# Patient Record
Sex: Female | Born: 1950 | Race: White | Hispanic: No | Marital: Married | Smoking: Never smoker
Health system: Southern US, Community
[De-identification: ages and names within clinical notes are randomized; demographics above are authoritative.]

## PROBLEM LIST (undated history)

## (undated) DIAGNOSIS — D649 Anemia, unspecified: Secondary | ICD-10-CM

## (undated) DIAGNOSIS — M1990 Unspecified osteoarthritis, unspecified site: Secondary | ICD-10-CM

## (undated) DIAGNOSIS — Z9221 Personal history of antineoplastic chemotherapy: Secondary | ICD-10-CM

## (undated) DIAGNOSIS — H9190 Unspecified hearing loss, unspecified ear: Secondary | ICD-10-CM

## (undated) DIAGNOSIS — H269 Unspecified cataract: Secondary | ICD-10-CM

## (undated) DIAGNOSIS — K219 Gastro-esophageal reflux disease without esophagitis: Secondary | ICD-10-CM

## (undated) DIAGNOSIS — C50919 Malignant neoplasm of unspecified site of unspecified female breast: Secondary | ICD-10-CM

## (undated) DIAGNOSIS — E785 Hyperlipidemia, unspecified: Secondary | ICD-10-CM

## (undated) DIAGNOSIS — I10 Essential (primary) hypertension: Secondary | ICD-10-CM

## (undated) DIAGNOSIS — Z923 Personal history of irradiation: Secondary | ICD-10-CM

## (undated) DIAGNOSIS — E119 Type 2 diabetes mellitus without complications: Secondary | ICD-10-CM

## (undated) HISTORY — PX: OTHER SURGICAL HISTORY: SHX169

## (undated) HISTORY — DX: Essential (primary) hypertension: I10

## (undated) HISTORY — DX: Anemia, unspecified: D64.9

## (undated) HISTORY — DX: Unspecified cataract: H26.9

## (undated) HISTORY — PX: COLONOSCOPY: SHX174

## (undated) HISTORY — DX: Type 2 diabetes mellitus without complications: E11.9

## (undated) HISTORY — DX: Malignant neoplasm of unspecified site of unspecified female breast: C50.919

## (undated) HISTORY — PX: TUBAL LIGATION: SHX77

## (undated) HISTORY — PX: NASAL SINUS SURGERY: SHX719

## (undated) HISTORY — PX: WISDOM TOOTH EXTRACTION: SHX21

## (undated) HISTORY — DX: Hyperlipidemia, unspecified: E78.5

## (undated) HISTORY — PX: TONSILLECTOMY: SUR1361

---

## 2003-05-11 DIAGNOSIS — C50919 Malignant neoplasm of unspecified site of unspecified female breast: Secondary | ICD-10-CM

## 2003-05-11 HISTORY — PX: BREAST LUMPECTOMY: SHX2

## 2003-05-11 HISTORY — DX: Malignant neoplasm of unspecified site of unspecified female breast: C50.919

## 2003-05-11 HISTORY — PX: BREAST SURGERY: SHX581

## 2003-10-29 ENCOUNTER — Encounter: Admission: RE | Admit: 2003-10-29 | Discharge: 2003-10-29 | Payer: Self-pay | Admitting: General Surgery

## 2003-10-29 ENCOUNTER — Encounter (INDEPENDENT_AMBULATORY_CARE_PROVIDER_SITE_OTHER): Payer: Self-pay | Admitting: *Deleted

## 2003-11-04 ENCOUNTER — Encounter (HOSPITAL_COMMUNITY): Admission: RE | Admit: 2003-11-04 | Discharge: 2004-02-02 | Payer: Self-pay | Admitting: General Surgery

## 2003-11-04 ENCOUNTER — Encounter: Payer: Self-pay | Admitting: General Surgery

## 2003-11-18 ENCOUNTER — Encounter (INDEPENDENT_AMBULATORY_CARE_PROVIDER_SITE_OTHER): Payer: Self-pay | Admitting: *Deleted

## 2003-11-18 ENCOUNTER — Ambulatory Visit (HOSPITAL_COMMUNITY): Admission: RE | Admit: 2003-11-18 | Discharge: 2003-11-18 | Payer: Self-pay | Admitting: General Surgery

## 2003-12-10 ENCOUNTER — Ambulatory Visit: Admission: RE | Admit: 2003-12-10 | Discharge: 2004-01-23 | Payer: Self-pay | Admitting: *Deleted

## 2003-12-18 ENCOUNTER — Ambulatory Visit: Admission: RE | Admit: 2003-12-18 | Discharge: 2003-12-18 | Payer: Self-pay | Admitting: Oncology

## 2003-12-18 ENCOUNTER — Ambulatory Visit (HOSPITAL_COMMUNITY): Admission: RE | Admit: 2003-12-18 | Discharge: 2003-12-18 | Payer: Self-pay | Admitting: General Surgery

## 2004-03-17 ENCOUNTER — Ambulatory Visit: Admission: RE | Admit: 2004-03-17 | Discharge: 2004-05-25 | Payer: Self-pay | Admitting: *Deleted

## 2004-05-01 ENCOUNTER — Ambulatory Visit (HOSPITAL_BASED_OUTPATIENT_CLINIC_OR_DEPARTMENT_OTHER): Admission: RE | Admit: 2004-05-01 | Discharge: 2004-05-01 | Payer: Self-pay | Admitting: General Surgery

## 2004-06-17 ENCOUNTER — Ambulatory Visit: Admission: RE | Admit: 2004-06-17 | Discharge: 2004-06-17 | Payer: Self-pay | Admitting: *Deleted

## 2004-06-24 ENCOUNTER — Ambulatory Visit: Payer: Self-pay | Admitting: Oncology

## 2004-07-28 ENCOUNTER — Encounter: Admission: RE | Admit: 2004-07-28 | Discharge: 2004-07-28 | Payer: Self-pay | Admitting: General Surgery

## 2004-08-03 ENCOUNTER — Encounter: Admission: RE | Admit: 2004-08-03 | Discharge: 2004-08-03 | Payer: Self-pay | Admitting: General Surgery

## 2004-08-05 ENCOUNTER — Encounter: Admission: RE | Admit: 2004-08-05 | Discharge: 2004-08-05 | Payer: Self-pay | Admitting: General Surgery

## 2004-11-18 ENCOUNTER — Ambulatory Visit: Payer: Self-pay | Admitting: Oncology

## 2005-05-07 ENCOUNTER — Ambulatory Visit: Payer: Self-pay | Admitting: Oncology

## 2005-08-04 ENCOUNTER — Encounter: Admission: RE | Admit: 2005-08-04 | Discharge: 2005-08-04 | Payer: Self-pay | Admitting: Oncology

## 2005-10-29 ENCOUNTER — Ambulatory Visit: Payer: Self-pay | Admitting: Oncology

## 2005-12-24 ENCOUNTER — Ambulatory Visit: Payer: Self-pay | Admitting: Oncology

## 2006-02-25 ENCOUNTER — Ambulatory Visit: Payer: Self-pay | Admitting: Oncology

## 2006-04-20 ENCOUNTER — Ambulatory Visit: Payer: Self-pay | Admitting: Oncology

## 2006-04-22 LAB — COMPREHENSIVE METABOLIC PANEL
ALT: 24 U/L (ref 0–35)
AST: 20 U/L (ref 0–37)
Albumin: 4.8 g/dL (ref 3.5–5.2)
Alkaline Phosphatase: 81 U/L (ref 39–117)
BUN: 20 mg/dL (ref 6–23)
CO2: 25 mEq/L (ref 19–32)
Calcium: 9.8 mg/dL (ref 8.4–10.5)
Chloride: 101 mEq/L (ref 96–112)
Creatinine, Ser: 0.63 mg/dL (ref 0.40–1.20)
Glucose, Bld: 215 mg/dL — ABNORMAL HIGH (ref 70–99)
Potassium: 4.1 mEq/L (ref 3.5–5.3)
Sodium: 139 mEq/L (ref 135–145)
Total Bilirubin: 0.4 mg/dL (ref 0.3–1.2)
Total Protein: 7.5 g/dL (ref 6.0–8.3)

## 2006-04-22 LAB — CBC WITH DIFFERENTIAL/PLATELET
Basophils Absolute: 0 10*3/uL (ref 0.0–0.1)
Eosinophils Absolute: 0.1 10*3/uL (ref 0.0–0.5)
HCT: 39.9 % (ref 34.8–46.6)
HGB: 13.4 g/dL (ref 11.6–15.9)
MCH: 30.3 pg (ref 26.0–34.0)
MONO#: 0.4 10*3/uL (ref 0.1–0.9)
NEUT#: 2.8 10*3/uL (ref 1.5–6.5)
NEUT%: 58 % (ref 39.6–76.8)
WBC: 4.8 10*3/uL (ref 3.9–10.0)
lymph#: 1.4 10*3/uL (ref 0.9–3.3)

## 2006-08-08 ENCOUNTER — Encounter: Admission: RE | Admit: 2006-08-08 | Discharge: 2006-08-08 | Payer: Self-pay | Admitting: Oncology

## 2006-10-13 ENCOUNTER — Ambulatory Visit: Payer: Self-pay | Admitting: Oncology

## 2007-03-13 ENCOUNTER — Ambulatory Visit: Payer: Self-pay | Admitting: Internal Medicine

## 2007-03-24 ENCOUNTER — Ambulatory Visit: Payer: Self-pay | Admitting: Internal Medicine

## 2007-03-24 ENCOUNTER — Encounter: Payer: Self-pay | Admitting: Internal Medicine

## 2007-04-14 ENCOUNTER — Ambulatory Visit: Payer: Self-pay | Admitting: Oncology

## 2007-04-18 LAB — CBC WITH DIFFERENTIAL/PLATELET
BASO%: 0.5 % (ref 0.0–2.0)
Basophils Absolute: 0 10*3/uL (ref 0.0–0.1)
EOS%: 3.9 % (ref 0.0–7.0)
HCT: 38.6 % (ref 34.8–46.6)
HGB: 13.4 g/dL (ref 11.6–15.9)
MCHC: 34.7 g/dL (ref 32.0–36.0)
MONO#: 0.4 10*3/uL (ref 0.1–0.9)
NEUT%: 55.4 % (ref 39.6–76.8)
RDW: 13.1 % (ref 11.3–14.5)
WBC: 5.7 10*3/uL (ref 3.9–10.0)
lymph#: 1.9 10*3/uL (ref 0.9–3.3)

## 2007-04-18 LAB — COMPREHENSIVE METABOLIC PANEL
ALT: 27 U/L (ref 0–35)
AST: 22 U/L (ref 0–37)
Albumin: 4.2 g/dL (ref 3.5–5.2)
CO2: 23 mEq/L (ref 19–32)
Calcium: 9.1 mg/dL (ref 8.4–10.5)
Chloride: 103 mEq/L (ref 96–112)
Creatinine, Ser: 0.59 mg/dL (ref 0.40–1.20)
Potassium: 3.8 mEq/L (ref 3.5–5.3)

## 2007-08-09 ENCOUNTER — Encounter: Admission: RE | Admit: 2007-08-09 | Discharge: 2007-08-09 | Payer: Self-pay | Admitting: Oncology

## 2007-10-13 ENCOUNTER — Ambulatory Visit: Payer: Self-pay | Admitting: Oncology

## 2007-10-19 ENCOUNTER — Encounter: Admission: RE | Admit: 2007-10-19 | Discharge: 2007-10-19 | Payer: Self-pay | Admitting: General Surgery

## 2008-04-16 ENCOUNTER — Ambulatory Visit: Payer: Self-pay | Admitting: Oncology

## 2008-04-18 LAB — COMPREHENSIVE METABOLIC PANEL
ALT: 21 U/L (ref 0–35)
BUN: 16 mg/dL (ref 6–23)
CO2: 24 mEq/L (ref 19–32)
Potassium: 3.9 mEq/L (ref 3.5–5.3)
Sodium: 138 mEq/L (ref 135–145)
Total Protein: 7.2 g/dL (ref 6.0–8.3)

## 2008-04-18 LAB — CBC WITH DIFFERENTIAL/PLATELET
BASO%: 0.5 % (ref 0.0–2.0)
Basophils Absolute: 0 10*3/uL (ref 0.0–0.1)
HCT: 37.9 % (ref 34.8–46.6)
HGB: 12.9 g/dL (ref 11.6–15.9)
LYMPH%: 33.5 % (ref 14.0–48.0)
MCH: 30.5 pg (ref 26.0–34.0)
MCHC: 34.1 g/dL (ref 32.0–36.0)
MCV: 89.6 fL (ref 81.0–101.0)
MONO%: 7.6 % (ref 0.0–13.0)
NEUT#: 3.1 10*3/uL (ref 1.5–6.5)
WBC: 5.7 10*3/uL (ref 3.9–10.0)

## 2008-08-22 ENCOUNTER — Encounter: Admission: RE | Admit: 2008-08-22 | Discharge: 2008-08-22 | Payer: Self-pay | Admitting: Oncology

## 2008-10-14 ENCOUNTER — Ambulatory Visit: Payer: Self-pay | Admitting: Oncology

## 2009-04-29 ENCOUNTER — Ambulatory Visit: Payer: Self-pay | Admitting: Oncology

## 2009-05-01 LAB — CBC WITH DIFFERENTIAL/PLATELET
HCT: 39.4 % (ref 34.8–46.6)
HGB: 13.2 g/dL (ref 11.6–15.9)
MCH: 30.5 pg (ref 25.1–34.0)
MCHC: 33.5 g/dL (ref 31.5–36.0)
MCV: 91 fL (ref 79.5–101.0)
NEUT#: 3.2 10*3/uL (ref 1.5–6.5)
Platelets: 270 10*3/uL (ref 145–400)
RDW: 13.4 % (ref 11.2–14.5)
WBC: 5.5 10*3/uL (ref 3.9–10.3)
nRBC: 0 % (ref 0–0)

## 2009-05-01 LAB — COMPREHENSIVE METABOLIC PANEL
Alkaline Phosphatase: 71 U/L (ref 39–117)
BUN: 15 mg/dL (ref 6–23)
CO2: 27 mEq/L (ref 19–32)
Glucose, Bld: 199 mg/dL — ABNORMAL HIGH (ref 70–99)
Potassium: 3.5 mEq/L (ref 3.5–5.3)
Sodium: 140 mEq/L (ref 135–145)
Total Bilirubin: 0.3 mg/dL (ref 0.3–1.2)

## 2009-08-25 ENCOUNTER — Encounter: Admission: RE | Admit: 2009-08-25 | Discharge: 2009-08-25 | Payer: Self-pay | Admitting: Oncology

## 2009-10-28 ENCOUNTER — Encounter: Admission: RE | Admit: 2009-10-28 | Discharge: 2009-10-28 | Payer: Self-pay | Admitting: General Surgery

## 2010-05-13 ENCOUNTER — Ambulatory Visit: Payer: Self-pay | Admitting: Oncology

## 2010-06-24 ENCOUNTER — Other Ambulatory Visit: Payer: Self-pay | Admitting: Oncology

## 2010-06-24 DIAGNOSIS — Z9889 Other specified postprocedural states: Secondary | ICD-10-CM

## 2010-08-27 ENCOUNTER — Ambulatory Visit
Admission: RE | Admit: 2010-08-27 | Discharge: 2010-08-27 | Disposition: A | Discharge status: Home / Self Care | Payer: BC Managed Care – HMO | Source: Ambulatory Visit | Attending: Oncology | Admitting: Oncology

## 2010-08-27 DIAGNOSIS — Z9889 Other specified postprocedural states: Secondary | ICD-10-CM

## 2010-09-25 NOTE — Op Note (Signed)
Krystal [name], Krystal [name]               ACCOUNT NO.:  [id number]   MEDICAL RECORD NO.:  [id number]          PATIENT TYPE:  AMB   LOCATION:  DSC                          FACILITY:  [facility]   PHYSICIAN:  Ollen Gross. Vernell Morgans, M.D. DATE OF BIRTH:  12-12-1950   DATE OF PROCEDURE:  05/01/2004  DATE OF DISCHARGE:  05/01/2004                                 OPERATIVE REPORT   PREOPERATIVE DIAGNOSES:  Left breast cancer.   POSTOPERATIVE DIAGNOSES:  Left breast cancer.   OPERATION PERFORMED:  Removal of right Port-A-Cath.   SURGEON:  Ollen Gross. Carolynne Edouard, M.D.   ANESTHESIA:  Local.   DESCRIPTION OF PROCEDURE:  After informed consent was obtained, the patient  was brought to the operating room and placed in supine position on the  operating table.  The patient's right chest wall area was then prepped with  Betadine and draped in the usual sterile manner.  The area of the port was  then infiltrated with 1% lidocaine with epinephrine.  A small incision was  made through the old incision with a 15 blade knife.  This incision was  carried down through the skin and subcutaneous tissue sharply with a 15  blade knife.  Once the catheter was identified, blunt dissection was carried  out around the catheter until the capsule around the catheter was entered.  The two anchoring stitches were identified and cut and removed.  The port  was then able to be popped out of the cavity and removed from the patient.  Pressure was held for several minutes.  The wound was then closed with a  deep layer of 3-0 Vicryl interrupted stitches and the skin was closed with a  running 4-0 Monocryl subcuticular stitch.  Benzoin and Steri-Strips and  sterile dressings were applied.  The patient tolerated the procedure well.  At the end of the case all sponge, needle and instrument counts were  correct.  The patient was then awakened and taken to recovery room in stable  condition.      [name]   PST/MEDQ  D:  05/03/2004  T:  05/04/2004   Job:  [id number]

## 2011-05-18 ENCOUNTER — Encounter: Payer: BC Managed Care – HMO | Admitting: Oncology

## 2011-05-19 NOTE — Progress Notes (Signed)
This encounter was created in error - please disregard.

## 2011-05-20 ENCOUNTER — Telehealth: Payer: Self-pay | Admitting: Oncology

## 2011-05-20 NOTE — Telephone Encounter (Signed)
lmonvm adviisng the pt of her r/s appts to march 2013

## 2011-07-19 ENCOUNTER — Telehealth: Payer: Self-pay | Admitting: Oncology

## 2011-07-19 NOTE — Telephone Encounter (Signed)
Pt came in on wrong day and 3/12 appt was r/s'd to 3/29. Date per pt.

## 2011-07-20 ENCOUNTER — Ambulatory Visit: Payer: BC Managed Care – HMO | Admitting: Oncology

## 2011-07-20 ENCOUNTER — Other Ambulatory Visit: Payer: BC Managed Care – HMO | Admitting: Lab

## 2011-08-06 ENCOUNTER — Telehealth: Payer: Self-pay | Admitting: Oncology

## 2011-08-06 ENCOUNTER — Ambulatory Visit (HOSPITAL_BASED_OUTPATIENT_CLINIC_OR_DEPARTMENT_OTHER): Payer: BC Managed Care – HMO | Admitting: Oncology

## 2011-08-06 ENCOUNTER — Other Ambulatory Visit: Payer: BC Managed Care – HMO | Admitting: Lab

## 2011-08-06 VITALS — BP 129/80 | HR 86 | Temp 97.7°F | Ht 60.0 in | Wt 170.9 lb

## 2011-08-06 DIAGNOSIS — C50919 Malignant neoplasm of unspecified site of unspecified female breast: Secondary | ICD-10-CM

## 2011-08-06 DIAGNOSIS — Z853 Personal history of malignant neoplasm of breast: Secondary | ICD-10-CM

## 2011-08-06 NOTE — Telephone Encounter (Signed)
gv pt appt for [id number].  scheduled mammogram for 04/29 @ [facility]

## 2011-08-06 NOTE — Progress Notes (Signed)
OFFICE PROGRESS NOTE   INTERVAL HISTORY:   She returns as scheduled. She feels well. Mild chronic discomfort at the right "hip ". No change at either breast. She is due for a mammogram in April. A bilateral mammogram was negative in April of 2012.  Objective:  Vital signs in last 24 hours:  Blood pressure 129/80, pulse 86, temperature 97.7 F (36.5 C), temperature source Oral, height 5' (1.524 m), weight 170 lb 14.4 oz (77.52 kg).    HEENT: Neck without mass Lymphatics: No cervical, supraclavicular, or axillary nodes Resp: Lungs clear bilaterally Cardio: Regular rate and rhythm GI: No hepatomegaly Vascular: No leg edema Breasts: Status post left lumpectomy. No evidence for local tumor recurrence. No discrete mass in either breast. Superior to the left areola and medial to a prominent vein there is a firm superficial 1/2-1 cm oblong area. I cannot palpate a discrete mass in this region. The area of firmness is approximately 1/2 way between the area lab and superior extent of the left breast.     Medications: I have reviewed the patient's current medications.  Assessment/Plan: Ms Rae has history of stage IIA left-sided breast cancer diagnosed in July 2005.  She completed 5 years of adjuvant Arimidex on the MA.27 study.  She completed adjuvant AC chemotherapy and left breast radiation prior to beginning hormonal therapy.   She remains in clinical remission.   Disposition:  She will be scheduled for a bilateral screening mammogram in April of 2013. She will return for an office visit here in December of 2013. She is scheduled to see Dr. Carolynne Edouard in June of this year.  The small firm area in the left breast is likely a benign finding. She will keep an eye on this and contact one of her physician if the area changes.   Lucile Shutters, MD  08/06/2011  3:24 PM

## 2011-09-06 ENCOUNTER — Ambulatory Visit
Admission: RE | Admit: 2011-09-06 | Discharge: 2011-09-06 | Disposition: A | Discharge status: Home / Self Care | Payer: BC Managed Care – PPO | Source: Ambulatory Visit | Attending: Oncology | Admitting: Oncology

## 2011-09-06 DIAGNOSIS — C50919 Malignant neoplasm of unspecified site of unspecified female breast: Secondary | ICD-10-CM

## 2011-10-25 ENCOUNTER — Encounter (INDEPENDENT_AMBULATORY_CARE_PROVIDER_SITE_OTHER): Payer: Self-pay | Admitting: General Surgery

## 2011-10-25 ENCOUNTER — Ambulatory Visit (INDEPENDENT_AMBULATORY_CARE_PROVIDER_SITE_OTHER): Payer: BC Managed Care – PPO | Admitting: General Surgery

## 2011-10-25 VITALS — BP 140/88 | HR 98 | Temp 98.0°F | Resp 14 | Ht 60.0 in | Wt 171.6 lb

## 2011-10-25 DIAGNOSIS — Z853 Personal history of malignant neoplasm of breast: Secondary | ICD-10-CM

## 2011-10-25 DIAGNOSIS — L723 Sebaceous cyst: Secondary | ICD-10-CM

## 2011-10-25 NOTE — Progress Notes (Signed)
Subjective:     Patient ID: Krystal [name], female   DOB: 19-Sep-1950, 61 y.o.   MRN: [id number]  HPI The patient is a 61 year old white female who is 8 years out from a left breast lumpectomy and negative sentinel node biopsy for a T2 N0 left breast cancer. She was ER and PR positive and finished 5 years of Arimidex. She also took chemotherapy and radiation. Her most recent mammogram was in April showed no evidence of malignancy. She denies any breast pain. Her only complaint is of a cyst on her posterior neck. This has been present for a few years and is gradually been getting bigger. It does cause her a little bit of discomfort when turning her head.  Review of Systems  Constitutional: Negative.   HENT: Negative.   Eyes: Negative.   Respiratory: Negative.   Cardiovascular: Negative.   Gastrointestinal: Negative.   Genitourinary: Negative.   Musculoskeletal: Negative.   Skin: Negative.   Neurological: Negative.   Hematological: Negative.   Psychiatric/Behavioral: Negative.        Objective:   Physical Exam  Constitutional: She is oriented to person, place, and time. She appears well-developed and well-nourished.  HENT:  Head: Normocephalic and atraumatic.  Eyes: Conjunctivae and EOM are normal. Pupils are equal, round, and reactive to light.  Neck: Normal range of motion. Neck supple.       There is a 1 cm subcutaneous mass that is mobile in the right posterior neck below the hairline. No evidence of infection.  Cardiovascular: Normal rate, regular rhythm and normal heart sounds.   Pulmonary/Chest: Effort normal and breath sounds normal.       There is no palpable mass in either breast. She does have some tightness to the scar in the 5:00 position of the left breast but this is stable. No palpable axillary supraclavicular or cervical lymphadenopathy.  Abdominal: Soft. Bowel sounds are normal.  Musculoskeletal: Normal range of motion.  Neurological: She is alert and oriented to  person, place, and time.  Skin: Skin is warm and dry.  Psychiatric: She has a normal mood and affect. Her behavior is normal.       Assessment:     8 years status post left breast lumpectomy and negative sentinel node biopsy. She now has a symptomatic sebaceous cyst on her posterior neck    Plan:     Because the area on her neck is bothering her and because the areas getting a little bit larger I think it would be best to have it removed. She would also like to have this done. I've discussed with her in detail the risks and benefits of the operation to remove the cyst from her neck as well as some of the technical aspects and she understands and wishes to proceed

## 2011-10-25 NOTE — Patient Instructions (Signed)
Continue regular self exams  

## 2011-11-22 ENCOUNTER — Other Ambulatory Visit (INDEPENDENT_AMBULATORY_CARE_PROVIDER_SITE_OTHER): Payer: Self-pay | Admitting: General Surgery

## 2011-11-22 DIAGNOSIS — L723 Sebaceous cyst: Secondary | ICD-10-CM

## 2011-11-23 ENCOUNTER — Telehealth (INDEPENDENT_AMBULATORY_CARE_PROVIDER_SITE_OTHER): Payer: Self-pay | Admitting: General Surgery

## 2011-11-23 NOTE — Telephone Encounter (Signed)
Spoke with patient and let her know that her first PO appt will be on 8/5 at 1:40.  I also sent a reminder card in the mail.

## 2011-12-13 ENCOUNTER — Encounter (INDEPENDENT_AMBULATORY_CARE_PROVIDER_SITE_OTHER): Payer: BC Managed Care – PPO | Admitting: General Surgery

## 2012-03-30 ENCOUNTER — Encounter: Payer: Self-pay | Admitting: Internal Medicine

## 2012-05-01 ENCOUNTER — Telehealth: Payer: Self-pay | Admitting: Oncology

## 2012-05-01 ENCOUNTER — Ambulatory Visit (HOSPITAL_BASED_OUTPATIENT_CLINIC_OR_DEPARTMENT_OTHER): Payer: BC Managed Care – PPO | Admitting: Oncology

## 2012-05-01 VITALS — BP 145/97 | HR 84 | Temp 96.8°F | Resp 18 | Ht 60.0 in | Wt 170.9 lb

## 2012-05-01 DIAGNOSIS — Z853 Personal history of malignant neoplasm of breast: Secondary | ICD-10-CM

## 2012-05-01 DIAGNOSIS — R030 Elevated blood-pressure reading, without diagnosis of hypertension: Secondary | ICD-10-CM

## 2012-05-01 NOTE — Progress Notes (Signed)
   [facility]    OFFICE PROGRESS NOTE   INTERVAL HISTORY:   She returns as scheduled. She feels well. No complaint. No change over either breast.  Objective:  Vital signs in last 24 hours:  Blood pressure 145/97, pulse 84, temperature 96.8 F (36 C), temperature source Oral, resp. rate 18, height 5' (1.524 m), weight 170 lb 14.4 oz (77.52 kg).    HEENT: Neck without mass Lymphatics: No cervical, supraclavicular, or axillary nodes Resp: Lungs clear bilaterally Cardio: Regular rate and rhythm GI: No hepatomegaly Vascular: No leg edema Breasts: Status post left lumpectomy. Firm tissue surrounding the inferior left breast scar. No evidence for local tumor recurrence. No mass in either breast    Medications: I have reviewed the patient's current medications.  Assessment/Plan:  She has a history of stage II a left-sided breast cancer diagnosed in July of 2005. She completed adjuvant AC chemotherapy and left breast radiation. She then completed 5 years of adjuvant Arimidex on the MA.27 study. She remains in clinical remission.  Disposition:  Ms. Pieper will be scheduled for a bilateral mammogram in April of 2014. She will see Dr. Carolynne Edouard in June of 2014. She will return for an office visit here in one year. Ms. Leap will contact us in the interim as needed.  Her blood pressure is elevated today. She monitors the blood pressure at home. She will seek medical attention if the blood pressure remains elevated.   Thornton Papas, MD  05/01/2012  11:09 AM

## 2012-05-01 NOTE — Telephone Encounter (Signed)
Gave pt appt for December 2014 MD only , mammogram on  April 30th 2014, pt aware of appt

## 2012-08-31 ENCOUNTER — Encounter: Payer: Self-pay | Admitting: Internal Medicine

## 2012-09-06 ENCOUNTER — Ambulatory Visit
Admission: RE | Admit: 2012-09-06 | Discharge: 2012-09-06 | Disposition: A | Discharge status: Home / Self Care | Payer: BC Managed Care – PPO | Source: Ambulatory Visit | Attending: Oncology | Admitting: Oncology

## 2012-09-06 DIAGNOSIS — Z853 Personal history of malignant neoplasm of breast: Secondary | ICD-10-CM

## 2012-10-31 ENCOUNTER — Encounter (INDEPENDENT_AMBULATORY_CARE_PROVIDER_SITE_OTHER): Payer: Self-pay | Admitting: General Surgery

## 2012-10-31 ENCOUNTER — Ambulatory Visit (INDEPENDENT_AMBULATORY_CARE_PROVIDER_SITE_OTHER): Payer: BC Managed Care – PPO | Admitting: General Surgery

## 2012-10-31 VITALS — BP 130/84 | HR 86 | Temp 98.6°F | Resp 14 | Ht 60.0 in | Wt 169.2 lb

## 2012-10-31 DIAGNOSIS — Z853 Personal history of malignant neoplasm of breast: Secondary | ICD-10-CM

## 2012-10-31 NOTE — Patient Instructions (Signed)
Continue regular self exams  

## 2012-10-31 NOTE — Progress Notes (Signed)
Subjective:     Patient ID: Krystal [name], female   DOB: 1950-09-17, 62 y.o.   MRN: [id number]  HPI The patient is a 62 year old white female who is 9 years status post left breast lumpectomy and negative sentinel node biopsy for a T2 N0 left breast cancer. She finished chemotherapy, radiation therapy, and 5 years of antiestrogen therapy. She has been well since her last visit. She denies any breast pain or discharge or nipple. She has no complaints today. Her most recent mammogram was in April and showed no evidence of malignancy.  Review of Systems  Constitutional: Negative.   HENT: Negative.   Eyes: Negative.   Respiratory: Negative.   Cardiovascular: Negative.   Gastrointestinal: Negative.   Endocrine: Negative.   Genitourinary: Negative.   Musculoskeletal: Negative.   Skin: Negative.   Allergic/Immunologic: Negative.   Neurological: Negative.   Hematological: Negative.   Psychiatric/Behavioral: Negative.        Objective:   Physical Exam  Constitutional: She is oriented to person, place, and time. She appears well-developed and well-nourished.  HENT:  Head: Normocephalic and atraumatic.  Eyes: Conjunctivae and EOM are normal. Pupils are equal, round, and reactive to light.  Neck: Normal range of motion. Neck supple.  Cardiovascular: Normal rate, regular rhythm and normal heart sounds.   Pulmonary/Chest: Effort normal and breath sounds normal.  She has some palpable fullness to her scar in the left inframammary fold at approximately the 5:00 position which is stable. Otherwise there is no palpable mass in either breast. There is no palpable axillary supraclavicular or cervical lymphadenopathy  Abdominal: Soft. Bowel sounds are normal. She exhibits no mass. There is no tenderness.  Musculoskeletal: Normal range of motion.  Lymphadenopathy:    She has no cervical adenopathy.  Neurological: She is alert and oriented to person, place, and time.  Skin: Skin is warm and dry.   Psychiatric: She has a normal mood and affect. Her behavior is normal.       Assessment:     The patient is 9 years status post left breast lumpectomy for breast cancer     Plan:     At this point she will continue to do regular self exams. We will plan to see her back in one year.

## 2012-11-01 ENCOUNTER — Encounter: Payer: Self-pay | Admitting: Internal Medicine

## 2012-11-28 ENCOUNTER — Ambulatory Visit (AMBULATORY_SURGERY_CENTER): Payer: BC Managed Care – PPO

## 2012-11-28 VITALS — Ht 60.0 in | Wt 169.8 lb

## 2012-11-28 DIAGNOSIS — Z86010 Personal history of colonic polyps: Secondary | ICD-10-CM

## 2012-11-28 MED ORDER — MOVIPREP 100 G PO SOLR: ORAL | Status: DC

## 2012-12-01 ENCOUNTER — Encounter: Payer: Self-pay | Admitting: Internal Medicine

## 2012-12-12 ENCOUNTER — Ambulatory Visit (AMBULATORY_SURGERY_CENTER): Payer: BC Managed Care – PPO | Admitting: Internal Medicine

## 2012-12-12 ENCOUNTER — Encounter: Payer: Self-pay | Admitting: Internal Medicine

## 2012-12-12 VITALS — BP 137/76 | HR 81 | Temp 98.2°F | Resp 14 | Ht 60.0 in | Wt 169.0 lb

## 2012-12-12 DIAGNOSIS — D126 Benign neoplasm of colon, unspecified: Secondary | ICD-10-CM

## 2012-12-12 DIAGNOSIS — Z86010 Personal history of colonic polyps: Secondary | ICD-10-CM

## 2012-12-12 LAB — GLUCOSE, CAPILLARY: Glucose-Capillary: 171 mg/dL — ABNORMAL HIGH (ref 70–99)

## 2012-12-12 MED ORDER — SODIUM CHLORIDE 0.9 % IV SOLN: 500.0000 mL | INTRAVENOUS | Status: DC

## 2012-12-12 NOTE — Progress Notes (Signed)
Called to room to assist during endoscopic procedure.  Patient ID and intended procedure confirmed with present staff. Received instructions for my participation in the procedure from the performing physician.  

## 2012-12-12 NOTE — Op Note (Signed)
[facility] [address] [place], [address]   COLONOSCOPY PROCEDURE REPORT  PATIENT: Krystal, [name]  MR#: [id number] BIRTHDATE: 1950-09-25 , 62  yrs. old GENDER: Female ENDOSCOPIST: Roxy Cedar, MD REFERRED DG:[id number] Program Recall PROCEDURE DATE:  12/12/2012 PROCEDURE:   Colonoscopy with snare polypectomy x 1 First Screening Colonoscopy - Avg.  risk and is 50 yrs.  old or older - No.  Prior Negative Screening - Now for repeat screening. N/A  History of Adenoma - Now for follow-up colonoscopy & has been > or = to 3 yrs.  Yes hx of adenoma.  Has been 3 or more years since last colonoscopy.  Polyps Removed Today? Yes. ASA CLASS:   Class II INDICATIONS:Patient's personal history of adenomatous colon polyps. Index exam 03-2007 (small TA) MEDICATIONS: MAC sedation, administered by CRNA and propofol (Diprivan) 200mg  IV  DESCRIPTION OF PROCEDURE:   After the risks benefits and alternatives of the procedure were thoroughly explained, informed consent was obtained.  A digital rectal exam revealed no abnormalities of the rectum.   The LB [id number]  endoscope was introduced through the anus and advanced to the cecum, which was identified by both the appendix and ileocecal valve. No adverse events experienced.   The quality of the prep was excellent, using MoviPrep  The instrument was then slowly withdrawn as the colon was fully examined.    COLON FINDINGS: A diminutive polyp was found in the rectum.  A polypectomy was performed with a cold snare.  The resection was complete and the polyp tissue was completely retrieved.   Moderate diverticulosis was noted in the sigmoid colon.   The colon mucosa was otherwise normal.  Retroflexed views revealed internal hemorrhoids. The time to cecum=1 minutes 59 seconds.  Withdrawal time=12 minutes 10 seconds.  The scope was withdrawn and the procedure completed. COMPLICATIONS: There were no  complications.  ENDOSCOPIC IMPRESSION: 1.   Diminutive polyp was found in the rectum; polypectomy was performed with a cold snare 2.   Moderate diverticulosis was noted in the sigmoid colon 3.   The colon mucosa was otherwise normal  RECOMMENDATIONS: 1. Repeat colonoscopy in 5 years if polyp adenomatous; otherwise 10 years   eSigned:  Roxy Cedar, MD 12/12/2012 1:52 PM  cc: Geoffry Paradise, MD and The Patient   PATIENT NAME:  Krystal, [name] MR#: [id number]

## 2012-12-12 NOTE — Progress Notes (Signed)
Patient did not have preoperative order for IV antibiotic SSI prophylaxis. (G8918)  Patient did not experience any of the following events: a burn prior to discharge; a fall within the facility; wrong site/side/patient/procedure/implant event; or a hospital transfer or hospital admission upon discharge from the facility. (G8907)  

## 2012-12-12 NOTE — Patient Instructions (Addendum)

## 2012-12-13 ENCOUNTER — Telehealth: Payer: Self-pay | Admitting: *Deleted

## 2012-12-13 NOTE — Telephone Encounter (Signed)
  Follow up Call-  Call back number 12/12/2012  Post procedure Call Back phone  # [phone]  Permission to leave phone message Yes      Patient questions:  Do you have a fever, pain , or abdominal swelling? no Pain Score  0 *  Have you tolerated food without any problems? yes  Have you been able to return to your normal activities? yes  Do you have any questions about your discharge instructions: Diet   no Medications  no Follow up visit  no  Do you have questions or concerns about your Care? no  Actions: * If pain score is 4 or above: No action needed, pain <4.

## 2012-12-25 ENCOUNTER — Encounter: Payer: Self-pay | Admitting: Internal Medicine

## 2013-05-01 ENCOUNTER — Ambulatory Visit (HOSPITAL_BASED_OUTPATIENT_CLINIC_OR_DEPARTMENT_OTHER): Payer: BC Managed Care – PPO | Admitting: Oncology

## 2013-05-01 ENCOUNTER — Telehealth: Payer: Self-pay | Admitting: Oncology

## 2013-05-01 VITALS — BP 142/80 | HR 81 | Temp 97.5°F | Resp 20 | Ht 60.0 in | Wt 165.9 lb

## 2013-05-01 DIAGNOSIS — Z853 Personal history of malignant neoplasm of breast: Secondary | ICD-10-CM

## 2013-05-01 NOTE — Progress Notes (Signed)
   [facility]    OFFICE PROGRESS NOTE   INTERVAL HISTORY:   She returns for scheduled followup of breast cancer. She feels well. No change over either breast. She currently has a "cold "that started with sinus congestion and she now has a cough. 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 A bilateral mammogram 09/07/2012 was negative.  Objective:  Vital signs in last 24 hours:  Blood pressure 142/80, pulse 81, temperature 97.5 F (36.4 C), temperature source Oral, resp. rate 20, height 5' (1.524 m), weight 165 lb 14.4 oz (75.252 kg).    HEENT: Neck without mass Lymphatics: No cervical, supraclavicular, or axillary nodes Resp: Left greater than right end inspiratory and expiratory wheezes, no respiratory distress Cardio: Regular rate and rhythm GI: No hepatomegaly Vascular: No leg Breast: Status post left lumpectomy. No evidence for local tumor recurrence. No mass in either breast.  Skin: There is a 1 cm erythematous papule anterior to the right areola, this has the appearance of a resolving "pimple "     Medications: I have reviewed the patient's current medications.  Assessment/Plan: She has a history of stage II a left-sided breast cancer diagnosed in July of 2005. She completed adjuvant AC chemotherapy and left breast radiation. She then completed 5 years of adjuvant Arimidex on the MA.27 study. She remains in clinical remission.     Disposition:  Ms. Badami would like to [facility]. She will return for an office visit in one year. She will be scheduled for a mammogram in June of 2015. She continues to see Dr. Carolynne Edouard.   Thornton Papas, MD  05/01/2013  4:39 PM

## 2013-05-01 NOTE — Telephone Encounter (Signed)
Gave pt appt for md and mammogram for Next year 2015

## 2013-10-18 ENCOUNTER — Ambulatory Visit
Admission: RE | Admit: 2013-10-18 | Discharge: 2013-10-18 | Disposition: A | Discharge status: Home / Self Care | Payer: BC Managed Care – PPO | Source: Ambulatory Visit | Attending: Oncology | Admitting: Oncology

## 2013-10-18 DIAGNOSIS — Z853 Personal history of malignant neoplasm of breast: Secondary | ICD-10-CM

## 2013-10-25 ENCOUNTER — Encounter (INDEPENDENT_AMBULATORY_CARE_PROVIDER_SITE_OTHER): Payer: Self-pay | Admitting: General Surgery

## 2014-02-01 ENCOUNTER — Other Ambulatory Visit: Payer: Self-pay | Admitting: Dermatology

## 2014-02-01 DIAGNOSIS — Z85828 Personal history of other malignant neoplasm of skin: Secondary | ICD-10-CM | POA: Diagnosis not present

## 2014-02-01 DIAGNOSIS — L821 Other seborrheic keratosis: Secondary | ICD-10-CM | POA: Diagnosis not present

## 2014-02-01 DIAGNOSIS — D1801 Hemangioma of skin and subcutaneous tissue: Secondary | ICD-10-CM | POA: Diagnosis not present

## 2014-02-01 DIAGNOSIS — L570 Actinic keratosis: Secondary | ICD-10-CM | POA: Diagnosis not present

## 2014-02-01 DIAGNOSIS — C4441 Basal cell carcinoma of skin of scalp and neck: Secondary | ICD-10-CM | POA: Diagnosis not present

## 2014-02-01 DIAGNOSIS — D045 Carcinoma in situ of skin of trunk: Secondary | ICD-10-CM | POA: Diagnosis not present

## 2014-02-13 ENCOUNTER — Telehealth: Payer: Self-pay | Admitting: Oncology

## 2014-02-13 NOTE — Telephone Encounter (Signed)
Lvm advising appt chg from 05/13/14 to 06/04/14 @ 3.45pm due to md on pal. Also mailed revised appt calendar.

## 2014-03-27 DIAGNOSIS — E669 Obesity, unspecified: Secondary | ICD-10-CM | POA: Diagnosis not present

## 2014-03-27 DIAGNOSIS — Z23 Encounter for immunization: Secondary | ICD-10-CM | POA: Diagnosis not present

## 2014-03-27 DIAGNOSIS — E11319 Type 2 diabetes mellitus with unspecified diabetic retinopathy without macular edema: Secondary | ICD-10-CM | POA: Diagnosis not present

## 2014-03-27 DIAGNOSIS — E785 Hyperlipidemia, unspecified: Secondary | ICD-10-CM | POA: Diagnosis not present

## 2014-03-27 DIAGNOSIS — I10 Essential (primary) hypertension: Secondary | ICD-10-CM | POA: Diagnosis not present

## 2014-03-27 DIAGNOSIS — Z6831 Body mass index (BMI) 31.0-31.9, adult: Secondary | ICD-10-CM | POA: Diagnosis not present

## 2014-05-13 ENCOUNTER — Ambulatory Visit: Payer: BC Managed Care – PPO | Admitting: Oncology

## 2014-05-25 ENCOUNTER — Telehealth: Payer: Self-pay | Admitting: Oncology

## 2014-05-25 NOTE — Telephone Encounter (Signed)
s.w. pt and advised on appt d.t change due to md on call...pt ok and aware

## 2014-06-04 ENCOUNTER — Ambulatory Visit: Payer: BC Managed Care – PPO | Admitting: Oncology

## 2014-06-07 ENCOUNTER — Telehealth: Payer: Self-pay | Admitting: Oncology

## 2014-06-07 ENCOUNTER — Ambulatory Visit (HOSPITAL_BASED_OUTPATIENT_CLINIC_OR_DEPARTMENT_OTHER): Payer: Medicare Other | Admitting: Oncology

## 2014-06-07 VITALS — BP 145/65 | HR 86 | Temp 97.7°F | Resp 18 | Ht 60.0 in | Wt 166.0 lb

## 2014-06-07 DIAGNOSIS — Z1231 Encounter for screening mammogram for malignant neoplasm of breast: Secondary | ICD-10-CM

## 2014-06-07 DIAGNOSIS — Z853 Personal history of malignant neoplasm of breast: Secondary | ICD-10-CM

## 2014-06-07 NOTE — Telephone Encounter (Signed)
Gave avs. Follow up not needed at time.

## 2014-06-07 NOTE — Progress Notes (Signed)
  Krystal [name] OFFICE PROGRESS NOTE   Diagnosis: Breast cancer  INTERVAL HISTORY:   She returns as scheduled. She feels well. No palpable change over either breast. No complaint. She is now retired. She is followed closely by Dr. Reynaldo Minium for management of diabetes.  A bilateral mammogram on 10/19/2013 was negative.  Objective:  Vital signs in last 24 hours:  Blood pressure 145/65, pulse 86, temperature 97.7 F (36.5 C), temperature source Oral, resp. rate 18, height 5' (1.524 m), weight 166 lb (75.297 kg), SpO2 98 %.    HEENT: Neck without mass Lymphatics: No cervical, supra-clavicular, or axillary nodes Resp: Lungs clear bilaterally Cardio: Regular in rhythm GI: No hepatomegaly Vascular: No leg edema Breasts: Status post left lumpectomy. No evidence for local tumor recurrence. No mass in either breast.     Medications: I have reviewed the patient's current medications.  Assessment/Plan: She has a history of stage II a left-sided breast cancer diagnosed in July of 2005. She completed adjuvant AC chemotherapy and left breast radiation. She then completed 5 years of adjuvant Arimidex on the MA.27 study. She remains in clinical remission.     Disposition:  Krystal [name] remains in clinical remission from breast cancer. She will be scheduled for a mammogram in June 2016. She plans to continue clinical follow-up with Dr. Reynaldo Minium. She was discharged from the Oncology clinic today. We are available to see her in the future as needed.  Krystal Coder, MD  06/07/2014  1:03 PM

## 2014-08-06 ENCOUNTER — Other Ambulatory Visit: Payer: Self-pay | Admitting: Dermatology

## 2014-10-21 ENCOUNTER — Ambulatory Visit
Admission: RE | Admit: 2014-10-21 | Discharge: 2014-10-21 | Disposition: A | Discharge status: Home / Self Care | Payer: Medicare Other | Source: Ambulatory Visit | Attending: Oncology | Admitting: Oncology

## 2014-10-21 DIAGNOSIS — Z1231 Encounter for screening mammogram for malignant neoplasm of breast: Secondary | ICD-10-CM

## 2014-10-21 DIAGNOSIS — Z853 Personal history of malignant neoplasm of breast: Secondary | ICD-10-CM

## 2015-07-31 ENCOUNTER — Other Ambulatory Visit: Payer: Self-pay | Admitting: Internal Medicine

## 2015-07-31 DIAGNOSIS — N644 Mastodynia: Secondary | ICD-10-CM

## 2015-08-06 ENCOUNTER — Ambulatory Visit
Admission: RE | Admit: 2015-08-06 | Discharge: 2015-08-06 | Disposition: A | Discharge status: Home / Self Care | Payer: Medicare Other | Source: Ambulatory Visit | Attending: Internal Medicine | Admitting: Internal Medicine

## 2015-08-06 DIAGNOSIS — N644 Mastodynia: Secondary | ICD-10-CM

## 2015-09-02 ENCOUNTER — Other Ambulatory Visit: Payer: Self-pay

## 2015-09-02 DIAGNOSIS — Z1231 Encounter for screening mammogram for malignant neoplasm of breast: Secondary | ICD-10-CM

## 2015-10-20 ENCOUNTER — Other Ambulatory Visit: Payer: Self-pay | Admitting: Obstetrics & Gynecology

## 2015-10-22 ENCOUNTER — Other Ambulatory Visit: Payer: Self-pay | Admitting: Obstetrics & Gynecology

## 2015-10-27 ENCOUNTER — Ambulatory Visit
Admission: RE | Admit: 2015-10-27 | Discharge: 2015-10-27 | Disposition: A | Discharge status: Home / Self Care | Payer: Medicare Other | Source: Ambulatory Visit

## 2015-10-27 ENCOUNTER — Other Ambulatory Visit: Payer: Self-pay | Admitting: Internal Medicine

## 2015-10-27 DIAGNOSIS — Z1231 Encounter for screening mammogram for malignant neoplasm of breast: Secondary | ICD-10-CM

## 2015-10-30 ENCOUNTER — Other Ambulatory Visit: Payer: Self-pay | Admitting: Internal Medicine

## 2015-10-30 DIAGNOSIS — R928 Other abnormal and inconclusive findings on diagnostic imaging of breast: Secondary | ICD-10-CM

## 2015-10-31 ENCOUNTER — Ambulatory Visit
Admission: RE | Admit: 2015-10-31 | Discharge: 2015-10-31 | Disposition: A | Discharge status: Home / Self Care | Payer: Medicare Other | Source: Ambulatory Visit | Attending: Internal Medicine | Admitting: Internal Medicine

## 2015-10-31 DIAGNOSIS — R928 Other abnormal and inconclusive findings on diagnostic imaging of breast: Secondary | ICD-10-CM

## 2015-11-10 NOTE — Patient Instructions (Addendum)
Your procedure is scheduled on: Tuesday, July 18  Enter through the [employer] [facility] at:  [place] up the phone at the desk and dial [phone].  Call this number if you have problems the morning of surgery: [phone].  Remember: Do NOT eat food after midnight Monday  Do NOT drink clear liquids after 8:30am Tuesday, day of surgery  Take these medicines the morning of surgery with a SIP OF WATER: valsartan-hydrochlorothiazide.  Withhold Metformin Monday night dose and Tuesday morning dose.  We will check your blood sugar upon arrival and treat if needed.  (Spoke with Jeannine, Diabetes Coordinator about patient's VGO Disposable insulin pump.  [place] for patient to leave on during surgery, surgery posted less than 2 hours.  [place] rate 1.67 units/hr.  She also has bolus 4 units delivered prior to meals 3 times a day.  Dr Gifford Shave Anesthesia is ok with this plan.)    Do NOT wear jewelry (body piercing), metal hair clips/bobby pins, make-up, or nail polish. Do NOT wear lotions, powders, or perfumes.  You may wear deoderant. Do NOT shave for 48 hours prior to surgery. Do NOT bring valuables to the hospital. Contacts may not be worn into surgery.  Leave suitcase in car.  After surgery it may be brought to your room.  For patients admitted to the hospital, checkout time is 11:00 AM the day of discharge. Home with friend Alveria Apley cell [phone]

## 2015-11-12 ENCOUNTER — Other Ambulatory Visit: Payer: Self-pay

## 2015-11-12 ENCOUNTER — Encounter (HOSPITAL_COMMUNITY): Payer: Self-pay

## 2015-11-12 ENCOUNTER — Encounter (HOSPITAL_COMMUNITY)
Admission: RE | Admit: 2015-11-12 | Discharge: 2015-11-12 | Disposition: A | Discharge status: Home / Self Care | Payer: Medicare Other | Source: Ambulatory Visit | Attending: Obstetrics & Gynecology | Admitting: Obstetrics & Gynecology

## 2015-11-12 DIAGNOSIS — Z0183 Encounter for blood typing: Secondary | ICD-10-CM | POA: Diagnosis not present

## 2015-11-12 DIAGNOSIS — N814 Uterovaginal prolapse, unspecified: Secondary | ICD-10-CM | POA: Diagnosis not present

## 2015-11-12 DIAGNOSIS — Z01812 Encounter for preprocedural laboratory examination: Secondary | ICD-10-CM | POA: Insufficient documentation

## 2015-11-12 HISTORY — DX: Unspecified osteoarthritis, unspecified site: M19.90

## 2015-11-12 HISTORY — DX: Unspecified hearing loss, unspecified ear: H91.90

## 2015-11-12 LAB — CBC
HEMATOCRIT: 37.8 % (ref 36.0–46.0)
HEMOGLOBIN: 12 g/dL (ref 12.0–15.0)
MCH: 28.2 pg (ref 26.0–34.0)
MCHC: 31.7 g/dL (ref 30.0–36.0)
MCV: 88.7 fL (ref 78.0–100.0)
PLATELETS: 315 10*3/uL (ref 150–400)
RBC: 4.26 MIL/uL (ref 3.87–5.11)
RDW: 16.1 % — ABNORMAL HIGH (ref 11.5–15.5)
WBC: 6.4 10*3/uL (ref 4.0–10.5)

## 2015-11-12 LAB — COMPREHENSIVE METABOLIC PANEL
ALK PHOS: 85 U/L (ref 38–126)
ALT: 15 U/L (ref 14–54)
AST: 19 U/L (ref 15–41)
Albumin: 3.7 g/dL (ref 3.5–5.0)
Anion gap: 8 (ref 5–15)
BILIRUBIN TOTAL: 0.5 mg/dL (ref 0.3–1.2)
BUN: 18 mg/dL (ref 6–20)
CHLORIDE: 101 mmol/L (ref 101–111)
CO2: 26 mmol/L (ref 22–32)
CREATININE: 0.7 mg/dL (ref 0.44–1.00)
Calcium: 8.8 mg/dL — ABNORMAL LOW (ref 8.9–10.3)
GFR calc Af Amer: >60 mL/min (ref >60)
Glucose, Bld: 125 mg/dL — ABNORMAL HIGH (ref 65–99)
Potassium: 4.1 mmol/L (ref 3.5–5.1)
Sodium: 135 mmol/L (ref 135–145)
Total Protein: 7.8 g/dL (ref 6.5–8.1)

## 2015-11-12 LAB — ABO/RH: ABO/RH(D): O POS

## 2015-11-12 LAB — TYPE AND SCREEN
ABO/RH(D): O POS
Antibody Screen: NEGATIVE

## 2015-11-12 NOTE — Pre-Procedure Instructions (Signed)
Spoke with Jeannine, Diabetes Coordinator at [phone] about patient's VGO Disposable insulin pump.  [place] for patient to leave pump on during surgery, surgery posted less than 2 hours.  Basel rate 1.67 units/hr.  She also has bolus of 4 units delivered prior to meals 3 times a day.  Dr Gifford Shave Anesthesia is ok with this plan.  Patient will bring extra supply if pump comes off.  Patient verified understanding.

## 2015-11-24 MED ORDER — CEFAZOLIN SODIUM-DEXTROSE 2-4 GM/100ML-% IV SOLN
2.0000 g | INTRAVENOUS | Status: AC
  Administered 2015-11-25: 2 g via INTRAVENOUS

## 2015-11-25 ENCOUNTER — Inpatient Hospital Stay (HOSPITAL_COMMUNITY)
Admission: AD | Admit: 2015-11-25 | Discharge: 2015-11-27 | DRG: 742 | Disposition: A | Discharge status: Home / Self Care | Payer: Medicare Other | Source: Ambulatory Visit | Attending: Obstetrics & Gynecology | Admitting: Obstetrics & Gynecology

## 2015-11-25 ENCOUNTER — Encounter (HOSPITAL_COMMUNITY)
Admission: AD | Disposition: A | Discharge status: Home / Self Care | Payer: Self-pay | Source: Ambulatory Visit | Attending: Obstetrics & Gynecology

## 2015-11-25 ENCOUNTER — Encounter (HOSPITAL_COMMUNITY): Payer: Self-pay | Admitting: Anesthesiology

## 2015-11-25 ENCOUNTER — Ambulatory Visit (HOSPITAL_COMMUNITY): Payer: Medicare Other | Admitting: Anesthesiology

## 2015-11-25 ENCOUNTER — Ambulatory Visit (HOSPITAL_COMMUNITY): Payer: Medicare Other

## 2015-11-25 DIAGNOSIS — H9193 Unspecified hearing loss, bilateral: Secondary | ICD-10-CM | POA: Diagnosis present

## 2015-11-25 DIAGNOSIS — E119 Type 2 diabetes mellitus without complications: Secondary | ICD-10-CM | POA: Diagnosis not present

## 2015-11-25 DIAGNOSIS — S3730XA Unspecified injury of urethra, initial encounter: Secondary | ICD-10-CM

## 2015-11-25 DIAGNOSIS — Z794 Long term (current) use of insulin: Secondary | ICD-10-CM | POA: Diagnosis not present

## 2015-11-25 DIAGNOSIS — Z9641 Presence of insulin pump (external) (internal): Secondary | ICD-10-CM | POA: Diagnosis present

## 2015-11-25 DIAGNOSIS — N9971 Accidental puncture and laceration of a genitourinary system organ or structure during a genitourinary system procedure: Secondary | ICD-10-CM | POA: Diagnosis not present

## 2015-11-25 DIAGNOSIS — Z8249 Family history of ischemic heart disease and other diseases of the circulatory system: Secondary | ICD-10-CM | POA: Diagnosis not present

## 2015-11-25 DIAGNOSIS — N814 Uterovaginal prolapse, unspecified: Secondary | ICD-10-CM | POA: Diagnosis present

## 2015-11-25 DIAGNOSIS — N941 Unspecified dyspareunia: Secondary | ICD-10-CM | POA: Diagnosis present

## 2015-11-25 DIAGNOSIS — Z923 Personal history of irradiation: Secondary | ICD-10-CM

## 2015-11-25 DIAGNOSIS — Z90710 Acquired absence of both cervix and uterus: Secondary | ICD-10-CM | POA: Diagnosis present

## 2015-11-25 DIAGNOSIS — Z853 Personal history of malignant neoplasm of breast: Secondary | ICD-10-CM

## 2015-11-25 DIAGNOSIS — I10 Essential (primary) hypertension: Secondary | ICD-10-CM | POA: Diagnosis not present

## 2015-11-25 DIAGNOSIS — S3720XA Unspecified injury of bladder, initial encounter: Secondary | ICD-10-CM

## 2015-11-25 HISTORY — PX: LAPAROSCOPIC VAGINAL HYSTERECTOMY WITH SALPINGO OOPHORECTOMY: SHX6681

## 2015-11-25 HISTORY — PX: CYSTOSCOPY: SHX5120

## 2015-11-25 LAB — CREATININE, SERUM
CREATININE: 0.72 mg/dL (ref 0.44–1.00)
GFR calc Af Amer: >60 mL/min (ref >60)

## 2015-11-25 LAB — CBC
HCT: 34.3 % — ABNORMAL LOW (ref 36.0–46.0)
Hemoglobin: 11.1 g/dL — ABNORMAL LOW (ref 12.0–15.0)
MCH: 28.3 pg (ref 26.0–34.0)
MCHC: 32.4 g/dL (ref 30.0–36.0)
MCV: 87.5 fL (ref 78.0–100.0)
Platelets: 311 10*3/uL (ref 150–400)
RBC: 3.92 MIL/uL (ref 3.87–5.11)
RDW: 16.2 % — AB (ref 11.5–15.5)
WBC: 13.3 10*3/uL — ABNORMAL HIGH (ref 4.0–10.5)

## 2015-11-25 LAB — GLUCOSE, CAPILLARY
GLUCOSE-CAPILLARY: 145 mg/dL — AB (ref 65–99)
GLUCOSE-CAPILLARY: 199 mg/dL — AB (ref 65–99)
Glucose-Capillary: 174 mg/dL — ABNORMAL HIGH (ref 65–99)
Glucose-Capillary: 234 mg/dL — ABNORMAL HIGH (ref 65–99)

## 2015-11-25 SURGERY — HYSTERECTOMY, VAGINAL, LAPAROSCOPY-ASSISTED, WITH SALPINGO-OOPHORECTOMY
Anesthesia: General | Site: Bladder

## 2015-11-25 MED ORDER — DIPHENHYDRAMINE HCL 50 MG/ML IJ SOLN
INTRAMUSCULAR | Status: DC | PRN
  Administered 2015-11-25: 6 mg via INTRAVENOUS
  Administered 2015-11-25: 12.5 mg via INTRAVENOUS

## 2015-11-25 MED ORDER — HYDROMORPHONE HCL 1 MG/ML IJ SOLN
INTRAMUSCULAR | Status: AC
  Filled 2015-11-25: qty 1

## 2015-11-25 MED ORDER — CEFAZOLIN SODIUM-DEXTROSE 2-4 GM/100ML-% IV SOLN
2.0000 g | Freq: Three times a day (TID) | INTRAVENOUS | Status: AC
  Administered 2015-11-26 (×3): 2 g via INTRAVENOUS
  Filled 2015-11-25 (×3): qty 100

## 2015-11-25 MED ORDER — METHYLENE BLUE 0.5 % INJ SOLN
INTRAVENOUS | Status: DC | PRN
  Administered 2015-11-25: 70 mg via INTRAVENOUS

## 2015-11-25 MED ORDER — ACETAMINOPHEN 325 MG PO TABS
650.0000 mg | ORAL_TABLET | ORAL | Status: DC | PRN
  Administered 2015-11-26: 650 mg via ORAL
  Filled 2015-11-25: qty 2

## 2015-11-25 MED ORDER — FENTANYL CITRATE (PF) 100 MCG/2ML IJ SOLN
INTRAMUSCULAR | Status: DC | PRN
Start: 2015-11-25 — End: 2015-11-25
  Administered 2015-11-25: 50 ug via INTRAVENOUS
  Administered 2015-11-25: 25 ug via INTRAVENOUS
  Administered 2015-11-25: 100 ug via INTRAVENOUS
  Administered 2015-11-25: 25 ug via INTRAVENOUS
  Administered 2015-11-25 (×2): 50 ug via INTRAVENOUS
  Administered 2015-11-25 (×2): 25 ug via INTRAVENOUS

## 2015-11-25 MED ORDER — LIDOCAINE HCL (CARDIAC) 20 MG/ML IV SOLN
INTRAVENOUS | Status: AC
  Filled 2015-11-25: qty 5

## 2015-11-25 MED ORDER — EPHEDRINE SULFATE 50 MG/ML IJ SOLN
INTRAMUSCULAR | Status: DC | PRN
  Administered 2015-11-25 (×2): 10 mg via INTRAVENOUS
  Administered 2015-11-25: 15 mg via INTRAVENOUS
  Administered 2015-11-25 (×2): 5 mg via INTRAVENOUS

## 2015-11-25 MED ORDER — IOPAMIDOL (ISOVUE-300) INJECTION 61%
INTRAVENOUS | Status: DC | PRN
  Administered 2015-11-25: 25 mL via INTRAVENOUS

## 2015-11-25 MED ORDER — ROCURONIUM BROMIDE 100 MG/10ML IV SOLN
INTRAVENOUS | Status: DC | PRN
  Administered 2015-11-25: 50 mg via INTRAVENOUS

## 2015-11-25 MED ORDER — DEXAMETHASONE SODIUM PHOSPHATE 4 MG/ML IJ SOLN
INTRAMUSCULAR | Status: AC
  Filled 2015-11-25: qty 1

## 2015-11-25 MED ORDER — KETOROLAC TROMETHAMINE 30 MG/ML IJ SOLN
INTRAMUSCULAR | Status: DC | PRN
  Administered 2015-11-25: 15 mg via INTRAVENOUS

## 2015-11-25 MED ORDER — DIPHENHYDRAMINE HCL 50 MG/ML IJ SOLN
INTRAMUSCULAR | Status: AC
  Filled 2015-11-25: qty 1

## 2015-11-25 MED ORDER — KETOROLAC TROMETHAMINE 30 MG/ML IJ SOLN: 30.0000 mg | Freq: Four times a day (QID) | INTRAMUSCULAR | Status: DC

## 2015-11-25 MED ORDER — FENTANYL CITRATE (PF) 100 MCG/2ML IJ SOLN
INTRAMUSCULAR | Status: AC
  Filled 2015-11-25: qty 2

## 2015-11-25 MED ORDER — STERILE WATER FOR IRRIGATION IR SOLN
Status: DC | PRN
  Administered 2015-11-25: 3000 mL

## 2015-11-25 MED ORDER — ONDANSETRON HCL 4 MG/2ML IJ SOLN
INTRAMUSCULAR | Status: AC
  Filled 2015-11-25: qty 2

## 2015-11-25 MED ORDER — ONDANSETRON HCL 4 MG/2ML IJ SOLN: 4.0000 mg | Freq: Four times a day (QID) | INTRAMUSCULAR | Status: DC | PRN

## 2015-11-25 MED ORDER — HYDROMORPHONE HCL 1 MG/ML IJ SOLN
INTRAMUSCULAR | Status: DC | PRN
  Administered 2015-11-25: 1 mg via INTRAVENOUS

## 2015-11-25 MED ORDER — MENTHOL 3 MG MT LOZG: 1.0000 | LOZENGE | OROMUCOSAL | Status: DC | PRN

## 2015-11-25 MED ORDER — ONDANSETRON HCL 4 MG/2ML IJ SOLN
INTRAMUSCULAR | Status: DC | PRN
  Administered 2015-11-25: 4 mg via INTRAVENOUS

## 2015-11-25 MED ORDER — BUPIVACAINE HCL (PF) 0.25 % IJ SOLN
INTRAMUSCULAR | Status: DC | PRN
  Administered 2015-11-25: 9 mL

## 2015-11-25 MED ORDER — OXYCODONE-ACETAMINOPHEN 5-325 MG PO TABS
1.0000 | ORAL_TABLET | ORAL | Status: DC | PRN
  Administered 2015-11-26 (×2): 1 via ORAL
  Filled 2015-11-25 (×3): qty 1

## 2015-11-25 MED ORDER — FENTANYL CITRATE (PF) 250 MCG/5ML IJ SOLN
INTRAMUSCULAR | Status: AC
  Filled 2015-11-25: qty 5

## 2015-11-25 MED ORDER — ZOLPIDEM TARTRATE 5 MG PO TABS: 5.0000 mg | ORAL_TABLET | Freq: Every evening | ORAL | Status: DC | PRN

## 2015-11-25 MED ORDER — INSULIN PUMP
Freq: Three times a day (TID) | SUBCUTANEOUS | Status: DC
  Administered 2015-11-25: 1 via SUBCUTANEOUS
  Administered 2015-11-26 (×4): via SUBCUTANEOUS
  Administered 2015-11-27: 4 via SUBCUTANEOUS
  Filled 2015-11-25: qty 1

## 2015-11-25 MED ORDER — SCOPOLAMINE 1 MG/3DAYS TD PT72: 1.0000 | MEDICATED_PATCH | Freq: Once | TRANSDERMAL | Status: DC

## 2015-11-25 MED ORDER — LACTATED RINGERS IV SOLN
INTRAVENOUS | Status: DC
  Administered 2015-11-26 (×2): via INTRAVENOUS

## 2015-11-25 MED ORDER — DEXAMETHASONE SODIUM PHOSPHATE 10 MG/ML IJ SOLN: INTRAMUSCULAR | Status: DC | PRN

## 2015-11-25 MED ORDER — PROPOFOL 10 MG/ML IV BOLUS
INTRAVENOUS | Status: AC
  Filled 2015-11-25: qty 20

## 2015-11-25 MED ORDER — LIDOCAINE-EPINEPHRINE 1 %-1:100000 IJ SOLN
INTRAMUSCULAR | Status: AC
  Filled 2015-11-25: qty 1

## 2015-11-25 MED ORDER — SUGAMMADEX SODIUM 500 MG/5ML IV SOLN
INTRAVENOUS | Status: DC | PRN
  Administered 2015-11-25: 150 mg via INTRAVENOUS

## 2015-11-25 MED ORDER — MORPHINE SULFATE (PF) 4 MG/ML IV SOLN: 2.0000 mg | INTRAVENOUS | Status: DC | PRN

## 2015-11-25 MED ORDER — EPHEDRINE 5 MG/ML INJ
INTRAVENOUS | Status: AC
  Filled 2015-11-25: qty 10

## 2015-11-25 MED ORDER — LIDOCAINE HCL (CARDIAC) 20 MG/ML IV SOLN
INTRAVENOUS | Status: DC | PRN
  Administered 2015-11-25: 30 mg via INTRAVENOUS
  Administered 2015-11-25: 80 mg via INTRAVENOUS

## 2015-11-25 MED ORDER — CEFAZOLIN SODIUM-DEXTROSE 2-4 GM/100ML-% IV SOLN
INTRAVENOUS | Status: AC
  Filled 2015-11-25: qty 100

## 2015-11-25 MED ORDER — DEXAMETHASONE SODIUM PHOSPHATE 4 MG/ML IJ SOLN
INTRAMUSCULAR | Status: DC | PRN
  Administered 2015-11-25: 4 mg via INTRAVENOUS

## 2015-11-25 MED ORDER — PROPOFOL 10 MG/ML IV BOLUS
INTRAVENOUS | Status: DC | PRN
  Administered 2015-11-25: 200 mg via INTRAVENOUS

## 2015-11-25 MED ORDER — CEFAZOLIN SODIUM-DEXTROSE 2-3 GM-% IV SOLR
INTRAVENOUS | Status: DC | PRN
  Administered 2015-11-25: 2 g via INTRAVENOUS

## 2015-11-25 MED ORDER — LACTATED RINGERS IV SOLN
INTRAVENOUS | Status: DC | PRN
  Administered 2015-11-25: 17:00:00 via INTRAVENOUS

## 2015-11-25 MED ORDER — HYDROMORPHONE HCL 1 MG/ML IJ SOLN: 0.2500 mg | INTRAMUSCULAR | Status: DC | PRN

## 2015-11-25 MED ORDER — MIDAZOLAM HCL 2 MG/2ML IJ SOLN: INTRAMUSCULAR | Status: DC | PRN

## 2015-11-25 MED ORDER — BUPIVACAINE HCL (PF) 0.25 % IJ SOLN
INTRAMUSCULAR | Status: AC
  Filled 2015-11-25: qty 30

## 2015-11-25 MED ORDER — METHYLENE BLUE 0.5 % INJ SOLN
INTRAVENOUS | Status: AC
  Filled 2015-11-25: qty 10

## 2015-11-25 MED ORDER — ENOXAPARIN SODIUM 40 MG/0.4ML ~~LOC~~ SOLN
40.0000 mg | SUBCUTANEOUS | Status: DC
  Administered 2015-11-26 – 2015-11-27 (×2): 40 mg via SUBCUTANEOUS
  Filled 2015-11-25 (×2): qty 0.4

## 2015-11-25 MED ORDER — METFORMIN HCL 500 MG PO TABS
500.0000 mg | ORAL_TABLET | Freq: Two times a day (BID) | ORAL | Status: DC
  Administered 2015-11-25 – 2015-11-27 (×3): 500 mg via ORAL
  Filled 2015-11-25 (×4): qty 1

## 2015-11-25 MED ORDER — ESTROGENS, CONJUGATED 0.625 MG/GM VA CREA
TOPICAL_CREAM | VAGINAL | Status: DC | PRN
  Administered 2015-11-25: 1 via VAGINAL

## 2015-11-25 MED ORDER — KETOROLAC TROMETHAMINE 30 MG/ML IJ SOLN
INTRAMUSCULAR | Status: AC
Start: 2015-11-25 — End: 2015-11-25
  Filled 2015-11-25: qty 1

## 2015-11-25 MED ORDER — FAMOTIDINE IN NACL 20-0.9 MG/50ML-% IV SOLN
20.0000 mg | Freq: Two times a day (BID) | INTRAVENOUS | Status: DC
  Administered 2015-11-25 – 2015-11-26 (×3): 20 mg via INTRAVENOUS
  Filled 2015-11-25 (×5): qty 50

## 2015-11-25 MED ORDER — ROCURONIUM BROMIDE 100 MG/10ML IV SOLN
INTRAVENOUS | Status: AC
  Filled 2015-11-25: qty 1

## 2015-11-25 MED ORDER — PROMETHAZINE HCL 25 MG/ML IJ SOLN: 6.2500 mg | INTRAMUSCULAR | Status: DC | PRN

## 2015-11-25 MED ORDER — LIDOCAINE-EPINEPHRINE 1 %-1:100000 IJ SOLN
INTRAMUSCULAR | Status: DC | PRN
  Administered 2015-11-25: 18 mL

## 2015-11-25 MED ORDER — SODIUM CHLORIDE 0.9 % IJ SOLN
INTRAMUSCULAR | Status: AC
  Filled 2015-11-25: qty 10

## 2015-11-25 MED ORDER — LACTATED RINGERS IV SOLN: INTRAVENOUS | Status: DC

## 2015-11-25 MED ORDER — SODIUM CHLORIDE 0.9 % IV SOLN
INTRAVENOUS | Status: DC
  Administered 2015-11-25 (×2): via INTRAVENOUS

## 2015-11-25 MED ORDER — KETOROLAC TROMETHAMINE 30 MG/ML IJ SOLN
30.0000 mg | Freq: Four times a day (QID) | INTRAMUSCULAR | Status: DC
  Administered 2015-11-25: 30 mg via INTRAVENOUS
  Filled 2015-11-25: qty 1

## 2015-11-25 MED ORDER — ONDANSETRON HCL 4 MG PO TABS: 4.0000 mg | ORAL_TABLET | Freq: Four times a day (QID) | ORAL | Status: DC | PRN

## 2015-11-25 MED ORDER — SIMETHICONE 80 MG PO CHEW
80.0000 mg | CHEWABLE_TABLET | Freq: Four times a day (QID) | ORAL | Status: DC
  Administered 2015-11-25 – 2015-11-26 (×5): 80 mg via ORAL
  Filled 2015-11-25 (×5): qty 1

## 2015-11-25 SURGICAL SUPPLY — 57 items
APPLICATOR ARISTA FLEXITIP XL (MISCELLANEOUS) ×4 IMPLANT
APPLIER CLIP ROT 10 11.4 M/L (STAPLE) ×4
CABLE HIGH FREQUENCY MONO STRZ (ELECTRODE) ×4 IMPLANT
CATH INTERMIT  6FR 70CM (CATHETERS) ×4 IMPLANT
CLIP APPLIE ROT 10 11.4 M/L (STAPLE) ×2 IMPLANT
CLOTH BEACON ORANGE TIMEOUT ST (SAFETY) ×4 IMPLANT
COVER BACK TABLE 60X90IN (DRAPES) ×4 IMPLANT
COVER LIGHT HANDLE  1/PK (MISCELLANEOUS) ×4
COVER LIGHT HANDLE 1/PK (MISCELLANEOUS) ×4 IMPLANT
DECANTER SPIKE VIAL GLASS SM (MISCELLANEOUS) ×8 IMPLANT
DRAIN JACKSON PRT FLT 7MM (DRAIN) ×4 IMPLANT
DRSG COVADERM PLUS 2X2 (GAUZE/BANDAGES/DRESSINGS) IMPLANT
DRSG OPSITE POSTOP 3X4 (GAUZE/BANDAGES/DRESSINGS) ×4 IMPLANT
DURAPREP 26ML APPLICATOR (WOUND CARE) ×4 IMPLANT
ELECT REM PT RETURN 9FT ADLT (ELECTROSURGICAL) ×4
ELECTRODE REM PT RTRN 9FT ADLT (ELECTROSURGICAL) ×2 IMPLANT
EVACUATOR SMOKE 8.L (FILTER) ×4 IMPLANT
GAUZE PACKING 1 X5 YD ST (GAUZE/BANDAGES/DRESSINGS) ×4 IMPLANT
GLOVE BIO SURGEON STRL SZ 6.5 (GLOVE) ×12 IMPLANT
GLOVE BIO SURGEON STRL SZ7 (GLOVE) ×8 IMPLANT
GLOVE BIO SURGEONS STRL SZ 6.5 (GLOVE) ×4
GLOVE BIOGEL PI IND STRL 7.0 (GLOVE) ×2 IMPLANT
GLOVE BIOGEL PI INDICATOR 7.0 (GLOVE) ×2
HEMOSTAT ARISTA ABSORB 1G (MISCELLANEOUS) ×4 IMPLANT
HEMOSTAT ARISTA ABSORB 3G PWDR (MISCELLANEOUS) ×4 IMPLANT
LEGGING LITHOTOMY PAIR STRL (DRAPES) ×4 IMPLANT
LIGASURE BLUNT 5MM 37CM (INSTRUMENTS) ×4 IMPLANT
LIGASURE IMPACT 36 18CM CVD LR (INSTRUMENTS) IMPLANT
LIQUID BAND (GAUZE/BANDAGES/DRESSINGS) ×4 IMPLANT
NS IRRIG 1000ML POUR BTL (IV SOLUTION) ×4 IMPLANT
PACK LAVH (CUSTOM PROCEDURE TRAY) ×4 IMPLANT
PACK ROBOTIC GOWN (GOWN DISPOSABLE) ×4 IMPLANT
PAD TRENDELENBURG POSITION (MISCELLANEOUS) ×4 IMPLANT
SCISSORS LAP 5X35 DISP (ENDOMECHANICALS) IMPLANT
SET CYSTO W/LG BORE CLAMP LF (SET/KITS/TRAYS/PACK) ×4 IMPLANT
SET IRRIG TUBING LAPAROSCOPIC (IRRIGATION / IRRIGATOR) IMPLANT
SLEEVE XCEL OPT CAN 5 100 (ENDOMECHANICALS) ×4 IMPLANT
SPONGE LAP 18X18 X RAY DECT (DISPOSABLE) ×4 IMPLANT
SUT MNCRL AB 4-0 PS2 18 (SUTURE) ×8 IMPLANT
SUT MON AB 4-0 PS1 27 (SUTURE) ×4 IMPLANT
SUT PROLENE 2 0 CT2 30 (SUTURE) ×16 IMPLANT
SUT SILK 2 0 SH (SUTURE) ×4 IMPLANT
SUT VIC AB 0 CT1 27 (SUTURE) ×8
SUT VIC AB 0 CT1 27XBRD ANBCTR (SUTURE) ×4 IMPLANT
SUT VIC AB 0 CT1 27XCR 8 STRN (SUTURE) ×4 IMPLANT
SUT VIC AB 2-0 CT1 (SUTURE) ×8 IMPLANT
SUT VICRYL 0 TIES 12 18 (SUTURE) ×4 IMPLANT
SUT VICRYL 0 UR6 27IN ABS (SUTURE) ×8 IMPLANT
SUT VLOC 180 3-0 9IN GS21 (SUTURE) ×8 IMPLANT
SYR BULB IRRIGATION 50ML (SYRINGE) ×4 IMPLANT
TOWEL OR 17X24 6PK STRL BLUE (TOWEL DISPOSABLE) ×8 IMPLANT
TRAY FOLEY CATH SILVER 14FR (SET/KITS/TRAYS/PACK) ×4 IMPLANT
TROCAR OPTI TIP 5M 100M (ENDOMECHANICALS) ×4 IMPLANT
TROCAR XCEL NON-BLD 11X100MML (ENDOMECHANICALS) ×4 IMPLANT
TROCAR XCEL NON-BLD 5MMX100MML (ENDOMECHANICALS) ×4 IMPLANT
WARMER LAPAROSCOPE (MISCELLANEOUS) ×4 IMPLANT
WATER STERILE IRR 1000ML POUR (IV SOLUTION) IMPLANT

## 2015-11-25 NOTE — H&P (Signed)
Krystal [name] is an 65 y.o. female presents for scheduled surgery  65 yo P2 post menopausal female presented to my office as a [facility] patient for an annual well woman examination. Her MD retired last year. Patient is concerned that she may have uterine prolapse. She first noticed sx Last Sunday after dinner when she moved a heavy flower pot. She has lower pelvic / abdominal pain that started that day and has not been alleviated by Naprosyn. Patient also feels vaginal pressure, a "bulge", and has lower back pain. She is SA with her husband and has occasional dyspareunia. She has rare USI sx, no pain, no anal incontinence. She recently had hematochezia but recent colonoscopy was negative.  Pt. with h/o breast cancer - L lumpectomy / chemo / rads - recently had left dx mammo / [place] - BCG - Negative Strong Fam Hx of Breast cancer - one maternal 1st cousin + for [facility] 2 gene.   Pertinent Gynecological History: Menses: post-menopausal Bleeding: none Contraception: none DES exposure: denies Blood transfusions: none Sexually transmitted diseases: no past history Previous GYN Procedures: c-section  Last mammogram: normal Date: 10/2014 Last pap: normal Date: 2015 OB History: G2, P2   Menstrual History: Menarche age: 14  No LMP recorded. Patient is postmenopausal.    Past Medical History  Diagnosis Date  . Hypertension   . Hyperlipidemia   . Breast cancer ([place]) 2005    left breast  . Diabetes mellitus without complication ([place])     type 2  . Arthritis     right thumb - no meds  . Hearing loss     bilateral - no hearing aids    Past Surgical History  Procedure Laterality Date  . Breast surgery  2005    Left Br Lumpectomy  . Cesarean section  2 times  . Nasal sinus surgery    . Chemo and radiation  2005-2006    for breast cancer/ 4 chemo treatments and 36 radiation treatments  . Tonsillectomy    . Colonoscopy      x 2  . Tubal ligation    . Wisdom tooth extraction      Family  History  Problem Relation Age of Onset  . Heart disease Mother   . Heart disease Father     Social History:  reports that she has never smoked. She has never used smokeless tobacco. She reports that she does not drink alcohol or use illicit drugs.  Allergies: No Known Allergies  Prescriptions prior to admission  Medication Sig Dispense Refill Last Dose  . calcium carbonate (TUMS EX) 750 MG chewable tablet Chew 1 tablet by mouth as needed for heartburn.   Past Week at Unknown time  . Insulin Human (INSULIN PUMP) SOLN Inject 1 each into the skin continuous. V-Go Disposable Insulin Delivery Device administers 78 units per 24 hours of insulin lispro (Humalog).     . metFORMIN (GLUCOPHAGE) 500 MG tablet Take 500 mg by mouth 2 (two) times daily.    11/24/2015 at Unknown time  . pravastatin (PRAVACHOL) 40 MG tablet Take 40 mg by mouth Nightly.   11/24/2015 at Unknown time  . valsartan-hydrochlorothiazide (DIOVAN-HCT) 160-25 MG per tablet Take 1 tablet by mouth Daily.   11/25/2015 at 0630  . Lancets (ONETOUCH ULTRASOFT) lancets    Taking  . ONE TOUCH ULTRA TEST test strip    Taking  . ULTICARE MINI PEN NEEDLES 31G X 6 MM MISC    Taking    Review  of Systems  Constitutional: Negative for fever and chills.  HENT: Negative for hearing loss.   Eyes: Negative for blurred vision and double vision.  Respiratory: Negative for cough and hemoptysis.   Cardiovascular: Negative for chest pain and palpitations.  Gastrointestinal: Negative for heartburn and nausea.  Genitourinary: Negative for dysuria and urgency.  Skin: Negative for itching and rash.  Neurological: Negative for dizziness, tingling and headaches.  Endo/Heme/Allergies: Negative for environmental allergies. Does not bruise/bleed easily.  All other systems reviewed and are negative.   Blood pressure 153/74, pulse 81, temperature 97.6 F (36.4 C), temperature source Oral, resp. rate 16, SpO2 98 %. Physical Exam  Constitutional: She is  oriented to person, place, and time. She appears well-developed and well-nourished.  HENT:  Head: Normocephalic.  Neck: Normal range of motion.  Cardiovascular: Normal rate.   Respiratory: Effort normal.  GI:  Inspection/Palpation/Auscultation: non-distended,  no tenderness,  no rebound,  no guarding,  soft,  no hepatomegaly,  no splenomegaly,  examination limited by habitus,  abdomen: incision Pfannenstiel  Hernia: none palpated    Genitourinary:  Vulva: no masses,  no lesions,  vulvar atrophy  Mons: normal,  no erythema,  no excoriation,  no atrophy,  no lesions,  no vesicles/ ulcers,  no masses,  no swelling,  no tenderness  Labia Majora: normal,  no erythema,  no excoriation,  no atrophy,  no discoloration,  no lesions,  no vesicles/ ulcers,  no masses,  no swelling,  no tenderness  Labia Minora: normal,  no erythema,  no excoriation,  no atrophy,  no discoloration,  no lesions,  no vesicles,  no masses,  no swelling,  no tenderness  Introitus: narrowed/contracted  Bartholin's Gland: normal  Vagina: normal,  no discharge,  no blood present,  no erythema,  no atrophy,  no lesions,  no ulcers,  no swelling,  no masses,  no tenderness,  no prolapse,  vaginal tone decreased  Cervix: grossly normal,  no lesions,  no discharge,  no bleeding,  no cervical motion tenderness  Uterus: normal contour,  midline,  mobile,  non-tender,  uterine prolapse stage 2  Urethral Meatus/ Urethra normal meatus,  no discharge,   well supported urethra,  no masses,  no tenderness  Bladder: non-distended,  no palpable mass,  non-tender  Adnexa/Parametria: no mass palpable,  no tenderness    Rectum  Anus & Perineum: normal perianal skin,  no anal fissure,  no hemorrhoids,  normal perineum  Digital Rectal: sphincter tone normal,  no hemorrhoids,  no masses,  normal rectovaginal septum    Musculoskeletal: Normal range of motion.  Neurological: She is alert and oriented to person, place, and time.    Results for  orders placed or performed during the hospital encounter of 11/25/15 (from the past 24 hour(s))  Glucose, capillary     Status: Abnormal   Collection Time: 11/25/15 11:16 AM  Result Value Ref Range   Glucose-Capillary 145 (H) 65 - 99 mg/dL    No results found.  Assessment/Plan: 65 yo with symptomatic pelvic relaxation / Stage II-III uterine prolapse.   There is no e/o a cystocele or rectocele.  Pt counseled on management options for SPR to include, but not limited to no intervention, pessary, physical therapy, and surgery. Pt desires definitive surgery at this time. The R/B of surgery were discussed w/ the pt to include, but not limited to bleeding/transfusion, infection, wound breakdown/poor healing, damage to organs in abd/pelvis w/ possible need for further surgical repair, need  for abdominal incision to complete procedure, blood clot, PE/MI/stroke, pain with intercourse, recurrent vaginal prolapse, worsening of urinary incontinence, non cure of symptoms.  Diagnosis of malignancy once pathology returns, requiring further therapy. Pt understands these risks and desires surgery.   EMBX was negative   On call to OR for LAVH (possible TAH)/ BSO / Cystoscopy.   Patient does not need a pap smear d/t her age and never having had an abnormal pap smear.    Linnell Swords, [place] 11/25/2015, 11:23 AM

## 2015-11-25 NOTE — Progress Notes (Addendum)
Pt A/O x4. Reviewed with patient V-go insulin pump that is placed left upper arm. Pt states the device delivers a dose of Humalog 1.67 units per hour and she covers meals with 4 units, "2 clicks", or according to meal. Patient contract for insulin pump signed and reviewed. Pt states she takes metformin 500mg  BID. Pt tolerating clear liquids at this time.

## 2015-11-25 NOTE — Consult Note (Signed)
Reason for Consult: Small Cystotomy  Referring Physician: Sanjuana Kava MD  Krystal [name] is an 65 y.o. female.   HPI:   1 - Small Cystotomy - small posterior midline cystotomy astutely noted by Dr. Alwyn Pea during cysto after extirpative portions of LAVH for benign disease. Emergent consultation requested for evaluation and mangement. No known h/o urologic surgery per records review.   Today "[name]" is seen for emergent intraoperative consultation.   Past Medical History  Diagnosis Date  . Hypertension   . Hyperlipidemia   . Breast cancer ([place]) 2005    left breast  . Diabetes mellitus without complication ([place])     type 2  . Arthritis     right thumb - no meds  . Hearing loss     bilateral - no hearing aids    Past Surgical History  Procedure Laterality Date  . Breast surgery  2005    Left Br Lumpectomy  . Cesarean section  2 times  . Nasal sinus surgery    . Chemo and radiation  2005-2006    for breast cancer/ 4 chemo treatments and 36 radiation treatments  . Tonsillectomy    . Colonoscopy      x 2  . Tubal ligation    . Wisdom tooth extraction      Family History  Problem Relation Age of Onset  . Heart disease Mother   . Heart disease Father     Social History:  reports that she has never smoked. She has never used smokeless tobacco. She reports that she does not drink alcohol or use illicit drugs.  Allergies: No Known Allergies  Medications: I have reviewed the patient's current medications.  Results for orders placed or performed during the hospital encounter of 11/25/15 (from the past 48 hour(s))  Glucose, capillary     Status: Abnormal   Collection Time: 11/25/15 11:16 AM  Result Value Ref Range   Glucose-Capillary 145 (H) 65 - 99 mg/dL  Glucose, capillary     Status: Abnormal   Collection Time: 11/25/15  2:51 PM  Result Value Ref Range   Glucose-Capillary 174 (H) 65 - 99 mg/dL  Glucose, capillary     Status: Abnormal   Collection Time: 11/25/15  5:42  PM  Result Value Ref Range   Glucose-Capillary 199 (H) 65 - 99 mg/dL    No results found.  Review of Systems  Unable to perform ROS: acuity of condition   Blood pressure 123/64, pulse 102, temperature 97.6 F (36.4 C), temperature source Oral, resp. rate 116, SpO2 95 %. Physical Exam  Constitutional: She appears well-developed and well-nourished.  [facility].  HENT:  Head: Normocephalic.  Cardiovascular: Normal rate.   GI:  Laparoscopic equipment with 3 ports in place.   Genitourinary:  Sutured vaginal cuff w/o prolapse or obvious fistulae  Neurological:  GCS 3T  Skin: Skin is warm.    Assessment/Plan:   1 - Small Cystotomy - two surgeon emergent consent obtained to proceed with cystotomy close, cysto, bilateral retrogrades with details as per separate operative note.  Following cystotomy repair, no evidence of ureteral injury, VVF , or persistant leak. Plan to leave foley in place x 7-14 days, then office cystogram in Urol office prior to removal. JP placed at surgery to remain to r/o significant urine leak over next few days.  Pt's husband updated on findings and plan and voiced understanding.   Please call me directly with questions anytime.  Gregory Dowe 11/25/2015, 6:27 PM

## 2015-11-25 NOTE — Brief Op Note (Signed)
11/25/2015  4:51 PM  PATIENT:  Krystal [name]  65 y.o. female  PRE-OPERATIVE DIAGNOSIS:  Small cystotomy  POST-OPERATIVE DIAGNOSIS:  Small cystotomy  PROCEDURE:  Laparoscopic cystotomy repair, cysto with bilateral retrograde pyelograms  SURGEON:  Surgeon(s) and Role:     * Alexis Frock, MD   PHYSICIAN ASSISTANT:   ASSISTANTS: none   ANESTHESIA:   general  EBL:  Total I/O In: 1000 [I.V.:1000] Out: 500 [Urine:400; Blood:100]  BLOOD ADMINISTERED:none  DRAINS: 1 - JP to bulb, 2 - foley to gravity   LOCAL MEDICATIONS USED:  NONE  SPECIMEN:  No Specimen  DISPOSITION OF SPECIMEN:  N/A  COUNTS:  YES  TOURNIQUET:  * No tourniquets in log *  DICTATION: .Other Dictation: Dictation Number [id number]  PLAN OF CARE: Admit to inpatient   PATIENT DISPOSITION:  turned back over to GYN team for remainder of surgery.    Delay start of Pharmacological VTE agent (>24hrs) due to surgical blood loss or risk of bleeding: yes

## 2015-11-25 NOTE — Op Note (Signed)
OPERATIVE NOTE  Krystal [name]  DOB:    06-Feb-1951  MRN:    [id number]  CSN:    [id number]  Date of Surgery:  11/25/2015  Preoperative Diagnosis: Symptomatic Pelvic relaxation  Postoperative Diagnosis: Same as above  Procedures: Laparoscopy Assisted Vaginal Hysterectomy Bilateral Salpingoopherectomy Cystoscopy Cystotomy repair done by Urology MD Dr. Tresa Moore   Surgeon:  Mady Haagensen. Castor Gittleman MD  Assistant: Allyn Kenner DO  Anesthetic: General ETA  Disposition:  The patient presents with the above-mentioned diagnosis. She understands the indications for surgical procedure. She also understands the alternative treatment options. She accepts the risk of, but not limited to, anesthetic complications, bleeding, infections, and possible damage to the surrounding organs.  Findings: Exam under anesthesia: external vulva normal vaginal vault normal cervix grossly normal uterus 10 weeks size mobile; marked descent, apical prolapse laparoscopy: normal appearing uterus, normal appearing ovaries and fallopian tubes bilaterally. Normal appearing appendix, normal liver edge and gallbladder. Cystoscopy: Hole in urothelium on patient's right side,  brisk jets from each ureteral orifice noted. Bladder water tight after repair. Intraoperative fluoroscopy shows patent ureters.   Procedure:  The patient was taken to the operating room where a general anesthetic was given. A time out was performed confirming the patient's name and procedure. An examination under anesthesia was performed. The patient's abdomen was prepped with ChloraPrep. The perineum and vagina were prepped with multiple layers of Betadine. A Foley catheter was placed in the bladder. A Grave's speculum was placed in the vagina and the anterior lip of the cervix was held with a single tooth tenaculum. A Hulka tenaculum was placed inside the uterus. The speculum and tenaculum was removed. The patient was sterilely draped. The  subumbilical area was injected with half percent Marcaine. An incision was made in the umbilicus with 11 blade scalpel. A 51mm Excel Visiport trocar was used with l50mm 0 degree laparoscope attached to perform direct entry into the patient's abdomen. Direct video visualization confirmed entry and pneumoperitoneum was obtained using approximately 3L CO2 gas. The patient was placed in steep Trendelenburg position. A small incision was made and a 5 mm trocar was inserted into the abdominal cavity along the right and left pelvis under direct visualization. There was no injury noted with placement of trocars. The pelvic contents were visualized and findings noted above, both ureters were identified crossing the pelvic brim and pelvic sidewall coursing well below operative field. Pictures were taken of the patient's pelvic structures. The left infundibulopelvic ligament was identified, cauterized and cut using the 5 mm Ligasure. The round ligament and ovarian ligaments were sequentially cauterized and cut using the 19mm Ligasure. The bladder flap was developed anteriorly. The same was then done on the right side, where the right IP was transected as well as the right round ligament was cauterized and cut using the 57mm Ligasure. The bladder flap was developed further.It was noted that the bladder was stuck to the anterior uterine wall on the right side attempt was made to push it down using an endopeanut.  The uterine arteries were coagulated and transected bilaterally using the 34mm Ligasure.   At this point we felt we were ready to proceed with the vaginal portion of the procedure. The patient was placed in a more lithotomy position. A weighted speculum was placed in the posterior vagina. The cervix was injected with 1% percent Lidocaine with epinephrine. A circumferential incision was made around the cervix using the bovie. The vaginal mucosa was advanced anteriorly and posteriorly. The anterior cul-de-sac  and  in the posterior cul-de-sac were sharply entered. The uterosacral ligaments were clamped with Heaney clamps, transected with Mayo scissors and suture ligated with 0-vicryl. The suture was tagged and left long for culdoplasty later. Alternating from right to left the cardinal ligaments, paracervical tissues, parametrial tissues, were clamped clamped with the Ligasure Impact cauterized and transected. The uterus with the fallopian tubes and ovaries were removed from the operative field. Hemostasis was confirmed. Tag sutures had been left on the remnants of the uterosacral ligaments.  The right uterosacral ligament tag was placed under traction to identify the remnants of the right uterosacral ligament.  A #0 PDS suture was placed to the proximal right uterosacral ligament and sutured to the anterior and posterior pelvic fascia beneath the vaginal cuff on the right side.  Identical process was performed on the left, although some difficulty was encountered in identifying and actually suturing through the attenuated left uterosacral ligament.  Both these sutures were tied to elevate the vaginal cuff.  The remaining vaginal cuff mucosa and anterior and posterior fascia were then closed in an interrupted manner using #2-0 Vicryl.  Careful inspection revealed complete hemostasis with good elevation of the vaginal cuff.  The foley catheter was removed and cystoscopy was performed. A 70 degree cystoscope was placed in the bladder and approximately 300 mL of sterile normal saline was placed in the bladder. A complete bladder survey was performed and there was a notable injury on the right side of the bladder. Urologist, Dr. Tresa Moore was called in to repair the defect (see his operative report). The bladder repair was done laparoscopically and intraoperative fluoroscopy was performed. Each ureteral orifice was visualized and there were brisk jets from each UO.   The operator then changed gown and gloves. The pneumoperitoneum  was reestablished. The pelvis was inspected and there was oozing from the vaginal cuff which was alleviated using bipolar cautery. The pelvis was irrigated thoroughly and Powder Surgicel was placed over the cuff. Hemostasis was adequate. We felt that we are ready to end the procedure. The 5 mm trochars were removed under direct visualization. The pneumoperitoneum was allowed to escape. The subumbilical trocar was removed. The subumbilical incision was closed using a deep suture of 2-0 Vicryl followed by skin closures of 3-0 Monocryl. Each incision was covered with Dermabond. The patient tolerated her procedure well. She was awakened from her anesthetic without difficulty and then transported to the recovery room in stable condition. Sponge, needle, and instrument counts were correct on 3 occasions.  The uterus with tubes and ovaries was sent to pathology.     Idania Desouza STACIA

## 2015-11-25 NOTE — Op Note (Signed)
NAMEJESSI, Krystal [name]               ACCOUNT NO.:  [id number]  MEDICAL RECORD NO.:  [id number]  LOCATION:  9309                          FACILITY:  [facility]  PHYSICIAN:  Alexis Frock, MD     DATE OF BIRTH:  1951/03/24  DATE OF PROCEDURE: 11/25/2015                               OPERATIVE REPORT   DIAGNOSIS:  Small cystotomy.  PROCEDURES: 1. Laparoscopic cystotomy repair. 2. Cystoscopy with bilateral retrograde pyelograms and interpretation.  ESTIMATED BLOOD LOSS:  Nil.  COMPLICATION:  None.  SPECIMEN:  None.  FINDINGS: 1. Small cystotomy in the midline approximately 2 cm superior to the     trigone. 2. Unremarkable bilateral retrograde pyelograms. 3. Complete resolution of visible cystotomy defect with cystoscopic     and laparoscopic vision following two-layer closure.  INDICATION:  Krystal [name] is a 65 year old lady undergoing laparoscopic- assisted vaginal hysterectomy today for significant uterine prolapse under the care of the Gynecology Team, it was astutely noted during their routine cystoscopy that the patient had a small superior cystotomy that was within several centimeters to the trigone and emergent intraoperative consultation was sought, examined, the clinical situation discussed with Dr. Alwyn Pea, the intraoperative findings and agreed that the cystotomy closure and cystoscopy with bilateral retrogrades would be warranted to fix the small injury and verified no ureteral components. Two-surgeon emergent consent was obtained.  PROCEDURE IN DETAIL:  The patient being Krystal [name], was verified. Procedure being cystotomy closure was confirmed.  Intravenous antibiotics had already been administered.  The two separate sterile fields were already created including an abdominal component with three laparoscopic ports and the vaginal component.  The vaginal cuff and mucosa had been closed at this point.  Initiated attention was directed at cystoscopy.  Cystoscopy was  performed using a 21-French cystoscope with 30-degree offset lens.  Inspection of the urinary bladder revealed a small cystotomy approximately 2 cm above the area of the trigone. There was no obvious component in the ureteral orifice injury with direct inspection.  There was clearly fat seen behind to the bladder via the cystoscopic approach.  There was no obvious vaginal component to the injury and the vaginal cuff appeared to be intact away from the small cystotomy via cystoscopic view.  Laparoscopic view was also obtained by placing the patient in steep Trendelenburg and 30-degree lens and a cystoscopically-guided open-ended catheter was placed purposely through the cystotomy to allow to be identified laparoscopically.  The cystotomy was approximately 2 cm.  There were no obvious foreign bodies within this and this appeared to be well away from the vaginal cuff and there was some redundant peritoneum in the vicinity, so it was that multilayer closure would be obtained laparoscopically.  As such, 3-0 V-Loc suture was used to perform laparoscopic cystotomy closure first of the seromuscular layer to close all visible planes at the cystotomy.  A second tissue interposition layer was used by making by developing a peritoneal flap over the area of cystotomy and anchoring completely to posterior peritoneum.  This resulted in excellent apposition in two- layer closure.  Final cystoscopic view revealed resolution of the obvious open defect in the bladder and there was no obvious large flow across the cystotomy  defect, laparoscopic vision simultaneously. Attention was directed to retrograde pyelograms.  The left ureteral orifice was cannulated with a 6-French end-hole catheter and left retrograde pyelogram was obtained.  Left retrograde pyelogram demonstrated a single left ureter at the level of the proximal ureter without any extravasation or narrowing or hydronephrosis seen whatsoever.   Similarly, right retrograde pyelogram was obtained.  Right retrograde pyelogram demonstrated a single right ureter at the level of the proximal ureter without any evidence of extravasation or narrowing or hydronephrosis whatsoever.  Foley catheter was then placed per urethra to straight drain.  Closed suction drain was brought through the previous left lateral most port site at the area of the peritoneal cavity under laparoscopic vision.  The surgery was turned over to the Gynecology Team for final closure.  The patient tolerated the procedure well.  There were no immediate periprocedural complications.  The patient was taken to the postanesthesia care unit in stable condition. I discussed with the primary team the urologic plan as well as the husband the findings, intraoperative consultation and plan as well.          ______________________________ Alexis Frock, MD     TM/MEDQ  D:  11/25/2015  T:  11/25/2015  Job:  [id number]

## 2015-11-25 NOTE — Anesthesia Procedure Notes (Signed)
Procedure Name: Intubation Date/Time: 11/25/2015 12:25 PM Performed by: Tobin Chad Pre-anesthesia Checklist: Patient identified, Emergency Drugs available, Suction available, Patient being monitored and Timeout performed Patient Re-evaluated:Patient Re-evaluated prior to inductionOxygen Delivery Method: Circle system utilized and Simple face mask Preoxygenation: Pre-oxygenation with 100% oxygen Intubation Type: IV induction Ventilation: Mask ventilation without difficulty Laryngoscope Size: Mac, 3 and Glidescope Grade View: Grade III Tube type: Oral Tube size: 7.0 mm Number of attempts: 3 Airway Equipment and Method: Stylet and Video-laryngoscopy Placement Confirmation: ETT inserted through vocal cords under direct vision,  positive ETCO2 and breath sounds checked- equal and bilateral Secured at: 22 cm Tube secured with: Tape Dental Injury: Teeth and Oropharynx as per pre-operative assessment  Difficulty Due To: Difficulty was unanticipated Future Recommendations: Recommend- induction with short-acting agent, and alternative techniques readily available

## 2015-11-25 NOTE — Transfer of Care (Signed)
Immediate Anesthesia Transfer of Care Note  Patient: Krystal [name]  Procedure(s) Performed: Procedure(s): LAPAROSCOPIC ASSISTED VAGINAL HYSTERECTOMY WITH BILATERAL SALPINGO OOPHORECTOMY, LAPARSCOPIC CYSTOTOMY REPAIR WITH BILATERAL RETROGRADES (Bilateral) CYSTOSCOPY (N/A)  Patient Location: PACU  Anesthesia Type:General  Level of Consciousness: awake  Airway & Oxygen Therapy: Patient Spontanous Breathing and Patient connected to nasal cannula oxygen  Post-op Assessment: Report given to RN and Post -op Vital signs reviewed and stable  Post vital signs: stable  Last Vitals:  Filed Vitals:   11/25/15 1102  BP: 153/74  Pulse: 81  Temp: 36.4 C  Resp: 16    Last Pain:  Filed Vitals:   11/25/15 1103  PainSc: 3          Complications: No apparent anesthesia complications

## 2015-11-25 NOTE — Brief Op Note (Signed)
11/25/2015  5:23 PM  PATIENT:  Krystal [name]  65 y.o. female  PRE-OPERATIVE DIAGNOSIS:  UTERINE PROLAPSE  POST-OPERATIVE DIAGNOSIS:  UTERINE PROLAPSE  PROCEDURE:  Procedure(s): LAPAROSCOPIC ASSISTED VAGINAL HYSTERECTOMY WITH BILATERAL SALPINGO OOPHORECTOMY, LAPARSCOPIC CYSTOTOMY REPAIR WITH BILATERAL RETROGRADES (Bilateral) CYSTOSCOPY (N/A)  SURGEON:  Surgeon(s) and Role:    * Sanjuana Kava, MD - Primary    * Allyn Kenner, DO - Assisting    * Alexis Frock, MD - Assisting  PHYSICIAN ASSISTANT: N/A   ANESTHESIA:   local and general  EBL:  Total I/O In: 2000 [I.V.:2000] Out: 700 [Urine:400; Blood:300]  BLOOD ADMINISTERED:none  DRAINS: Jackson-Pratt drain(s) with closed bulb suction in the pouch of douglas and Urinary Catheter (Foley)   LOCAL MEDICATIONS USED:  MARCAINE    and LIDOCAINE   SPECIMEN:  Source of Specimen:  uterus, cervix, bilateral tubes and ovaries  DISPOSITION OF SPECIMEN:  PATHOLOGY  COUNTS:  YES  TOURNIQUET:  * No tourniquets in log *  DICTATION: .Note written in EPIC  PLAN OF CARE: Admit to inpatient   PATIENT DISPOSITION:  PACU - hemodynamically stable.   Delay start of Pharmacological VTE agent (>24hrs) due to surgical blood loss or risk of bleeding: yes  Krystal [name] Krystal [name]

## 2015-11-25 NOTE — Anesthesia Preprocedure Evaluation (Addendum)
Anesthesia Evaluation  Patient identified by MRN, date of birth, ID band Patient awake    Reviewed: Allergy & Precautions, H&P , NPO status , Patient's Chart, lab work & pertinent test results  History of Anesthesia Complications Negative for: history of anesthetic complications  Airway Mallampati: II  TM Distance: >3 FB Neck ROM: full    Dental no notable dental hx.    Pulmonary neg pulmonary ROS,    Pulmonary exam normal breath sounds clear to auscultation       Cardiovascular hypertension, Normal cardiovascular exam Rhythm:regular Rate:Normal     Neuro/Psych negative neurological ROS     GI/Hepatic negative GI ROS, Neg liver ROS,   Endo/Other  diabetes  Renal/GU negative Renal ROS     Musculoskeletal  (+) Arthritis ,   Abdominal   Peds  Hematology negative hematology ROS (+)   Anesthesia Other Findings Glucose is 146 this am, insulin infusion on arm from home, little over 1unit/hr, she rarely ever has hypoglycemic episodes  Reproductive/Obstetrics negative OB ROS                            Anesthesia Physical Anesthesia Plan  ASA: II  Anesthesia Plan: General   Post-op Pain Management:    Induction: Intravenous  Airway Management Planned: Oral ETT  Additional Equipment:   Intra-op Plan:   Post-operative Plan:   Informed Consent: I have reviewed the patients History and Physical, chart, labs and discussed the procedure including the risks, benefits and alternatives for the proposed anesthesia with the patient or authorized representative who has indicated his/her understanding and acceptance.   Dental Advisory Given  Plan Discussed with: Anesthesiologist, CRNA and Surgeon  Anesthesia Plan Comments:         Anesthesia Quick Evaluation

## 2015-11-26 DIAGNOSIS — N814 Uterovaginal prolapse, unspecified: Secondary | ICD-10-CM | POA: Diagnosis not present

## 2015-11-26 LAB — GLUCOSE, CAPILLARY
GLUCOSE-CAPILLARY: 56 mg/dL — AB (ref 65–99)
GLUCOSE-CAPILLARY: 68 mg/dL (ref 65–99)
GLUCOSE-CAPILLARY: 122 mg/dL — AB (ref 65–99)
GLUCOSE-CAPILLARY: 107 mg/dL — AB (ref 65–99)
Glucose-Capillary: 118 mg/dL — ABNORMAL HIGH (ref 65–99)
Glucose-Capillary: 153 mg/dL — ABNORMAL HIGH (ref 65–99)
Glucose-Capillary: 217 mg/dL — ABNORMAL HIGH (ref 65–99)

## 2015-11-26 MED ORDER — KETOROLAC TROMETHAMINE 30 MG/ML IJ SOLN
15.0000 mg | Freq: Four times a day (QID) | INTRAMUSCULAR | Status: AC
  Administered 2015-11-26 (×4): 15 mg via INTRAVENOUS
  Filled 2015-11-26 (×4): qty 1

## 2015-11-26 MED ORDER — KETOROLAC TROMETHAMINE 30 MG/ML IJ SOLN: 15.0000 mg | Freq: Four times a day (QID) | INTRAMUSCULAR | Status: AC

## 2015-11-26 NOTE — Progress Notes (Signed)
Spoke with Dr. Philis Pique. Reported follow up CBG 217; pt gave 2 units via pump; recheck CBG in morning.

## 2015-11-26 NOTE — Progress Notes (Signed)
1 Day Post-Op  Subjective:  1 - Small Cystotomy - s/p laparoscopic repair of small posterior midline cystotomy 11/25/15. JP and foley placed post-op.   Today "Kinsely" is stable. No complaints. Minimal JP output, No foley problems.   Objective: Vital signs in last 24 hours: Temp:  [97.5 F (36.4 C)-99.5 F (37.5 C)] 98.6 F (37 C) (07/19 0607) Pulse Rate:  [76-110] 76 (07/19 0607) Resp:  [15-116] 16 (07/19 0607) BP: (116-153)/(50-86) 119/51 mmHg (07/19 0607) SpO2:  [93 %-99 %] 96 % (07/19 0607) Last BM Date: 11/24/15  Intake/Output from previous day: 07/18 0701 - 07/19 0700 In: 3666.7 [P.O.:340; I.V.:3066.7; IV Piggyback:150] Out: 2275 [Urine:1900; Blood:300] Intake/Output this shift: Total I/O In: 1426.7 [P.O.:340; I.V.:866.7; Other:70; IV Piggyback:150] Out: [id number] [Urine:1500; Other:75]  General appearance: alert, cooperative and appears stated age Eyes: negative Nose: Nares normal. Septum midline. Mucosa normal. No drainage or sinus tenderness. Throat: lips, mucosa, and tongue normal; teeth and gums normal Neck: supple, symmetrical, trachea midline Resp: non-labored on  O2 Cardio: Nl rate GI: soft, non-tender; bowel sounds normal; no masses,  no organomegaly Pelvic: external genitalia normal and catheter in place wtih clear urine / no clots. Pulses: 2+ and symmetric Lymph nodes: Cervical, supraclavicular, and axillary nodes normal. Neurologic: Grossly normal Incision/Wound: abd port sites c/d/i. JP with scant serosanguinaous fluid.   Lab Results:   Recent Labs  11/25/15 1823  WBC 13.3*  HGB 11.1*  HCT 34.3*  PLT 311   BMET  Recent Labs  11/25/15 1823  CREATININE 0.72   PT/INR No results for input(s): LABPROT, INR in the last 72 hours. ABG No results for input(s): PHART, HCO3 in the last 72 hours.  Invalid input(s): PCO2, PO2  Studies/Results: No results found.  Anti-infectives: Anti-infectives    Start     Dose/Rate Route Frequency Ordered Stop    11/26/15 0100  ceFAZolin (ANCEF) IVPB 2g/100 mL premix     2 g 200 mL/hr over 30 Minutes Intravenous Every 8 hours 11/25/15 1726     11/25/15 0600  ceFAZolin (ANCEF) IVPB 2g/100 mL premix     2 g 200 mL/hr over 30 Minutes Intravenous On call to O.R. 11/24/15 1213 11/25/15 1207      Assessment/Plan:  1 - Small Cystotomy - doing well. Keep JP and foley for now. I will likely remove JP tomorrow and pt likely ok for DC tomorrow (7/20) from Urol issues. We will arrange for outpatient office cystogram in around 10-14 days at our office.  Please call me directly with questions anytime.  [facility], Anisah Kuck 11/26/2015

## 2015-11-26 NOTE — Progress Notes (Signed)
1 Day Post-Op Procedure(s) (LRB): LAPAROSCOPIC ASSISTED VAGINAL HYSTERECTOMY WITH BILATERAL SALPINGO OOPHORECTOMY, LAPARSCOPIC CYSTOTOMY REPAIR WITH BILATERAL RETROGRADES (Bilateral) CYSTOSCOPY (N/A)  Subjective: Patient reports incisional pain, tolerating PO and + flatus.  She is ambulating w/o difficulty Pain is well controlled. No chest pain, no shortness of breath.   Objective: I have reviewed patient's vital signs, intake and output, medications and labs.  General: alert, cooperative and no distress GI: soft, non-tender; bowel sounds normal; no masses,  no organomegaly and incision: clean, dry, intact and covered in bandage; JP drain output 24Hr 60cc, 10cc present this am Extremities: extremities normal, atraumatic, no cyanosis or edema, Homans sign is negative, no sign of DVT and SCDs on and running Vaginal Bleeding: none vaginal packing  Assessment: s/p Procedure(s): LAPAROSCOPIC ASSISTED VAGINAL HYSTERECTOMY WITH BILATERAL SALPINGO OOPHORECTOMY, LAPARSCOPIC CYSTOTOMY REPAIR WITH BILATERAL RETROGRADES (Bilateral) CYSTOSCOPY (N/A): progressing well  Plan: Advance diet Discontinue IV fluids Continue ABX therapy due to bladder injury one more dose then will discontinue Urology will manage foley catheter, possible D/C of JP drain prior to discharge tomorrow  Continue SCDs and Lovenox for VTE prophylaxis    LOS: 1 day    Charleston Hankin, [place] 11/26/2015, 7:59 AM

## 2015-11-26 NOTE — Anesthesia Postprocedure Evaluation (Signed)
Anesthesia Post Note  Patient: Krystal [name]  Procedure(s) Performed: Procedure(s) (LRB): LAPAROSCOPIC ASSISTED VAGINAL HYSTERECTOMY WITH BILATERAL SALPINGO OOPHORECTOMY, LAPARSCOPIC CYSTOTOMY REPAIR WITH BILATERAL RETROGRADES (Bilateral) CYSTOSCOPY (N/A)  Patient location during evaluation: [facility] Anesthesia Type: General Level of consciousness: awake, awake and alert, oriented and patient cooperative Pain management: pain level controlled Vital Signs Assessment: post-procedure vital signs reviewed and stable Respiratory status: spontaneous breathing, nonlabored ventilation and respiratory function stable Cardiovascular status: stable Postop Assessment: no signs of nausea or vomiting and adequate PO intake Anesthetic complications: no     Last Vitals:  Filed Vitals:   11/26/15 0155 11/26/15 0607  BP: 117/50 119/51  Pulse: 76 76  Temp: 36.4 C 37 C  Resp: 16 16    Last Pain:  Filed Vitals:   11/26/15 0748  PainSc: 3    Pain Goal: Patients Stated Pain Goal: 3 (11/26/15 0748)               Stacie Glaze C

## 2015-11-27 DIAGNOSIS — N814 Uterovaginal prolapse, unspecified: Secondary | ICD-10-CM | POA: Diagnosis not present

## 2015-11-27 LAB — GLUCOSE, CAPILLARY
GLUCOSE-CAPILLARY: 95 mg/dL (ref 65–99)
Glucose-Capillary: 82 mg/dL (ref 65–99)

## 2015-11-27 MED ORDER — CEPHALEXIN 500 MG PO CAPS: 500.0000 mg | ORAL_CAPSULE | Freq: Two times a day (BID) | ORAL | Status: DC

## 2015-11-27 MED ORDER — IOPAMIDOL (ISOVUE-300) INJECTION 61%: 90.0000 mL | Freq: Once | INTRAVENOUS | Status: AC | PRN

## 2015-11-27 MED ORDER — OXYCODONE-ACETAMINOPHEN 5-325 MG PO TABS: 1.0000 | ORAL_TABLET | ORAL | Status: DC | PRN

## 2015-11-27 NOTE — Progress Notes (Signed)
Patient is eating, ambulating, not voiding- foley in.  Pain control is good.  BP 159/77 mmHg  Pulse 81  Temp(Src) 98.8 F (37.1 C) (Oral)  Resp 16  Ht 5' (1.524 m)  Wt 74.39 kg (164 lb)  BMI 32.03 kg/m2  SpO2 97%  lungs:   clear to auscultation cor:    RRR Abdomen:  soft, appropriate tenderness, incisions intact and without erythema or exudate. ex:    no cords   Lab Results  Component Value Date   WBC 13.3* 11/25/2015   HGB 11.1* 11/25/2015   HCT 34.3* 11/25/2015   MCV 87.5 11/25/2015   PLT 311 11/25/2015    A/P  Routine care.  Expect d/c per plan.  Pt to manage diabetes with pump.  CBGs have been slightly lower yesterday am but better since.  Pt to go home with cath and f/u with Dr. Tresa Moore in 2 weeks for cystogram.  Will send with keflex for prophylaxis.

## 2015-11-27 NOTE — Progress Notes (Signed)
Patient discharged home with husband. Discharge paperwork and teaching completed with patient. Prescriptions given. Foley catheter home care taught to patient and husband. No questions at this time.

## 2015-11-27 NOTE — Discharge Instructions (Signed)
1 - You may have urinary urgency (bladder spasms) and bloody urine on / off with catheter in place. This is normal.  2.  You can use Azo Standard OTC,

## 2015-11-27 NOTE — Progress Notes (Signed)
2 Days Post-Op  Subjective:  1 - Small Cystotomy - s/p laparoscopic repair of small posterior midline cystotomy 11/25/15. JP and foley placed post-op. JP removed 7/20 as output scant.   Today "Krystal [name]" is stable. No complaints. Minimal JP output, No foley problems. Pain controlled. She is ambulatory.   Objective: Vital signs in last 24 hours: Temp:  [98 F (36.7 C)-98.8 F (37.1 C)] 98.2 F (36.8 C) (07/20 0142) Pulse Rate:  [75-92] 75 (07/20 0142) Resp:  [14-17] 14 (07/20 0142) BP: (119-154)/(51-67) 124/57 mmHg (07/20 0142) SpO2:  [96 %-98 %] 97 % (07/20 0142) Weight:  [74.39 kg (164 lb)] 74.39 kg (164 lb) (07/19 2300) Last BM Date: 11/25/15  Intake/Output from previous day: 07/19 0701 - 07/20 0700 In: 360 [P.O.:360] Out: 2205 [Urine:2150; Drains:55] Intake/Output this shift: Total I/O In: 360 [P.O.:360] Out: 1125 [Urine:1100; Drains:25]  General appearance: alert, cooperative and appears stated age Eyes: negative Back: symmetric, no curvature. ROM normal. No CVA tenderness. Resp: non-labored on room air Cardio: Nl rate Pelvic: No large vaginal drainage. Foley in place with very light pink urine.  Incision/Wound: Prior port sites c/d/i. JP with scant serosanguinaous fluid. Removed and dry dressing placed.   Lab Results:   Recent Labs  11/25/15 1823  WBC 13.3*  HGB 11.1*  HCT 34.3*  PLT 311   BMET  Recent Labs  11/25/15 1823  CREATININE 0.72   PT/INR No results for input(s): LABPROT, INR in the last 72 hours. ABG No results for input(s): PHART, HCO3 in the last 72 hours.  Invalid input(s): PCO2, PO2  Studies/Results: Dg Cystogram  11/26/2015  CLINICAL DATA:  Laparoscopic cyst ostomy repair. EXAM: RETROGRADE PYELOGRAM TECHNIQUE: Intraoperative retrograde pyelogram obtained for review on today's date. FLUOROSCOPY TIME:  0 minutes 2 seconds fluoroscopy time. Two images. COMPARISON:  No recent. FINDINGS: Contrast noted in nondilated distal ureters bilaterally.  No filling defects noted. IMPRESSION: Widely patent distal ureters bilaterally. No evidence of distal ureteral obstruction. Electronically Signed   By: Marcello Moores  Register   On: 11/26/2015 11:22    Anti-infectives: Anti-infectives    Start     Dose/Rate Route Frequency Ordered Stop   11/26/15 0100  ceFAZolin (ANCEF) IVPB 2g/100 mL premix     2 g 200 mL/hr over 30 Minutes Intravenous Every 8 hours 11/25/15 1726 11/26/15 1633   11/25/15 0600  ceFAZolin (ANCEF) IVPB 2g/100 mL premix     2 g 200 mL/hr over 30 Minutes Intravenous On call to O.R. 11/24/15 1213 11/25/15 1207      Assessment/Plan:  1 - Small Cystotomy - JP removed today. Current foley to continue for about 2 weeks, then office visit for cystogram and trial of void, likely on 12/11/15. Our office will call her to confirm. Pt updated on plan.  We will follow pt prn at this point.  Please call me directly with questions anytime.  [facility], Reinhardt Licausi 11/27/2015

## 2015-11-28 ENCOUNTER — Encounter (HOSPITAL_COMMUNITY): Payer: Self-pay | Admitting: Obstetrics & Gynecology

## 2015-11-28 MED ORDER — IOPAMIDOL (ISOVUE-300) INJECTION 61%: 90.0000 mL | Freq: Once | INTRAVENOUS | Status: AC | PRN

## 2015-12-25 NOTE — Discharge Summary (Signed)
Physician Discharge Summary   Patient ID: Krystal [name] [id number] 65 y.o. 23-Dec-1950  Admit date: 11/25/2015  Discharge date and time: 11/27/2015  9:51 AM   Admitting Physician: Sanjuana Kava, MD   Discharge Physician: Bobbye Charleston MD  Admission Diagnoses: UTERINE PROLAPSE  Discharge Diagnoses: s/p LAVH, BSO, Cystotomy repair  Admission Condition: good  Discharged Condition: good  Indication for Admission: Post operative care  Hospital Course: Patient taken to OR where LAVH, BSO was performed. Intraoperatively it was noted that a cystotomy had been done and Urology was called in to repair. Patient did well post operatively and met discharge criteria on post op day #2  Consults: urology  Significant Diagnostic Studies: post op H/H  Treatments: IV hydration, antibiotics: Ancef and surgery: Hysterectomy / BSO / Cystoscopy / Cystotomy repair  Discharge Exam: BP (!) 159/77 (BP Location: Right Arm)   Pulse 81   Temp 98.8 F (37.1 C) (Oral)   Resp 16   Ht 5' (1.524 m)   Wt 74.4 kg (164 lb)   SpO2 97%   BMI 32.03 kg/m   General Appearance:    Alert, cooperative, no distress, appears stated age     Back:     Symmetric, no curvature, ROM normal, no CVA tenderness  Lungs:     Clear to auscultation bilaterally, respirations unlabored  Chest Wall:    No tenderness or deformity   Heart:    Regular rate and rhythm, S1 and S2 normal, no murmur, rub   or gallop  Breast Exam:    No tenderness, masses, or nipple abnormality  Abdomen:     Soft, non-tender, bowel sounds active all four quadrants,    no masses, no organomegaly Incisions C/D/E  Genitalia:    No heavy bleeding foley catheter in place     Extremities:   Extremities normal, atraumatic, no cyanosis or edema                 Disposition: 01-Home or Self Care  Patient Instructions:    Medication List    TAKE these medications   calcium carbonate 750 MG chewable tablet Commonly known as:  TUMS EX Chew 1  tablet by mouth as needed for heartburn.   cephALEXin 500 MG capsule Commonly known as:  KEFLEX Take 1 capsule (500 mg total) by mouth 2 (two) times daily.   insulin pump Soln Inject 1 each into the skin continuous. V-Go Disposable Insulin Delivery Device administers 78 units per 24 hours of insulin lispro (Humalog).   metFORMIN 500 MG tablet Commonly known as:  GLUCOPHAGE Take 500 mg by mouth 2 (two) times daily.   ONE TOUCH ULTRA TEST test strip Generic drug:  glucose blood   onetouch ultrasoft lancets   oxyCODONE-acetaminophen 5-325 MG tablet Commonly known as:  ROXICET Take 1 tablet by mouth every 4 (four) hours as needed for severe pain.   pravastatin 40 MG tablet Commonly known as:  PRAVACHOL Take 40 mg by mouth Nightly.   ULTICARE MINI PEN NEEDLES 31G X 6 MM Misc Generic drug:  Insulin Pen Needle   valsartan-hydrochlorothiazide 160-25 MG tablet Commonly known as:  DIOVAN-HCT Take 1 tablet by mouth Daily.      Activity: no lifting, driving, or strenuous exercise for 2 weeks and no driving for 2 weeks Diet: regular diet Wound Care: keep wound clean and dry  Follow-up with Dr. Alwyn Pea  1 week.  SignedCaffie Damme 12/25/2015 1:09 PM

## 2016-06-04 ENCOUNTER — Other Ambulatory Visit: Payer: Self-pay | Admitting: Obstetrics & Gynecology

## 2016-09-27 ENCOUNTER — Other Ambulatory Visit: Payer: Self-pay | Admitting: Internal Medicine

## 2016-09-27 DIAGNOSIS — Z1231 Encounter for screening mammogram for malignant neoplasm of breast: Secondary | ICD-10-CM

## 2016-09-27 DIAGNOSIS — Z853 Personal history of malignant neoplasm of breast: Secondary | ICD-10-CM

## 2016-11-03 ENCOUNTER — Ambulatory Visit
Admission: RE | Admit: 2016-11-03 | Discharge: 2016-11-03 | Disposition: A | Discharge status: Home / Self Care | Payer: Medicare Other | Source: Ambulatory Visit | Attending: Internal Medicine | Admitting: Internal Medicine

## 2016-11-03 DIAGNOSIS — Z853 Personal history of malignant neoplasm of breast: Secondary | ICD-10-CM

## 2016-11-03 DIAGNOSIS — Z1231 Encounter for screening mammogram for malignant neoplasm of breast: Secondary | ICD-10-CM

## 2016-11-03 HISTORY — DX: Personal history of antineoplastic chemotherapy: Z92.21

## 2016-11-03 HISTORY — DX: Personal history of irradiation: Z92.3

## 2017-06-14 IMAGING — MG 2D DIGITAL DIAGNOSTIC UNILATERAL RIGHT MAMMOGRAM WITH CAD AND AD
6 series · 6 of 18 positions shown · non-contrast
Comparison: Previous exam(s).

CLINICAL DATA: Right superior breast nodule seen on most recent
screening mammography.

EXAM:
2D DIGITAL DIAGNOSTIC UNILATERAL RIGHT MAMMOGRAM WITH CAD AND
ADJUNCT TOMO

[R MLO]
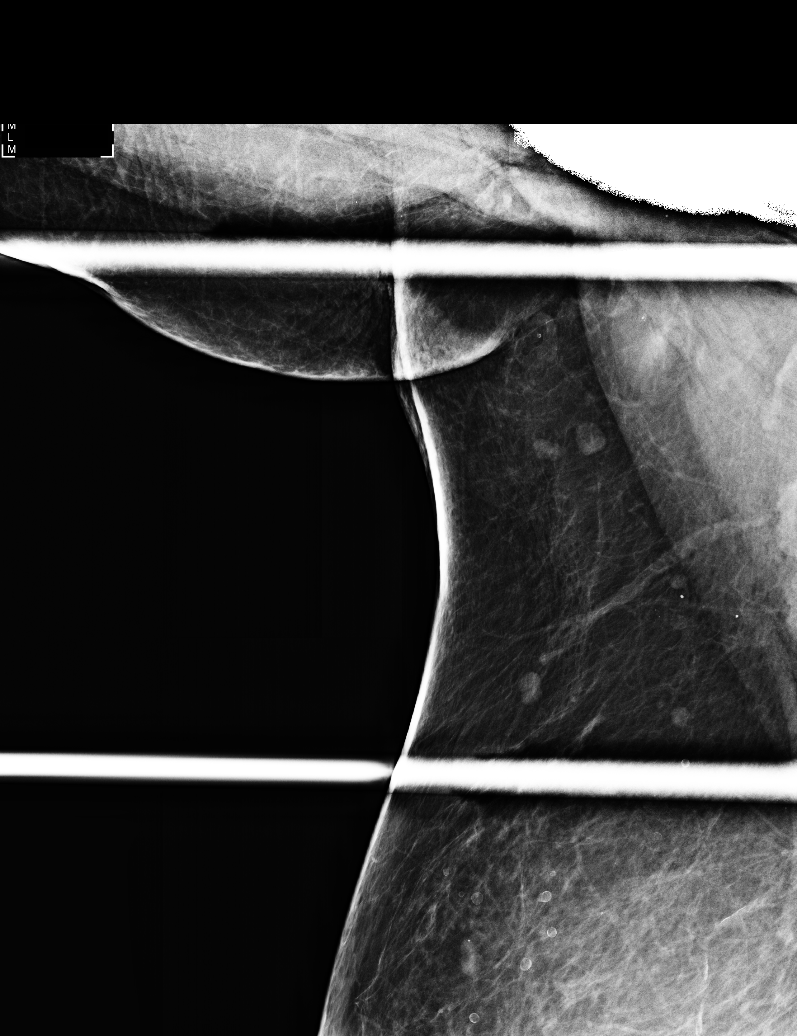

[R XCCL]
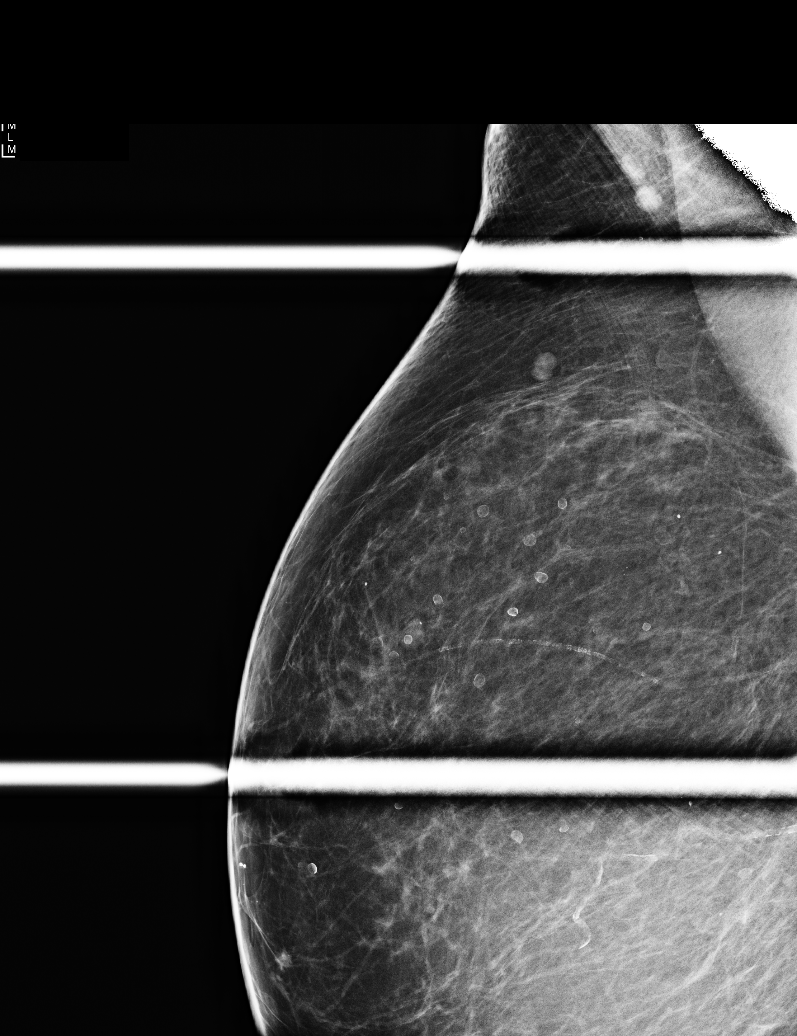

[R ML]
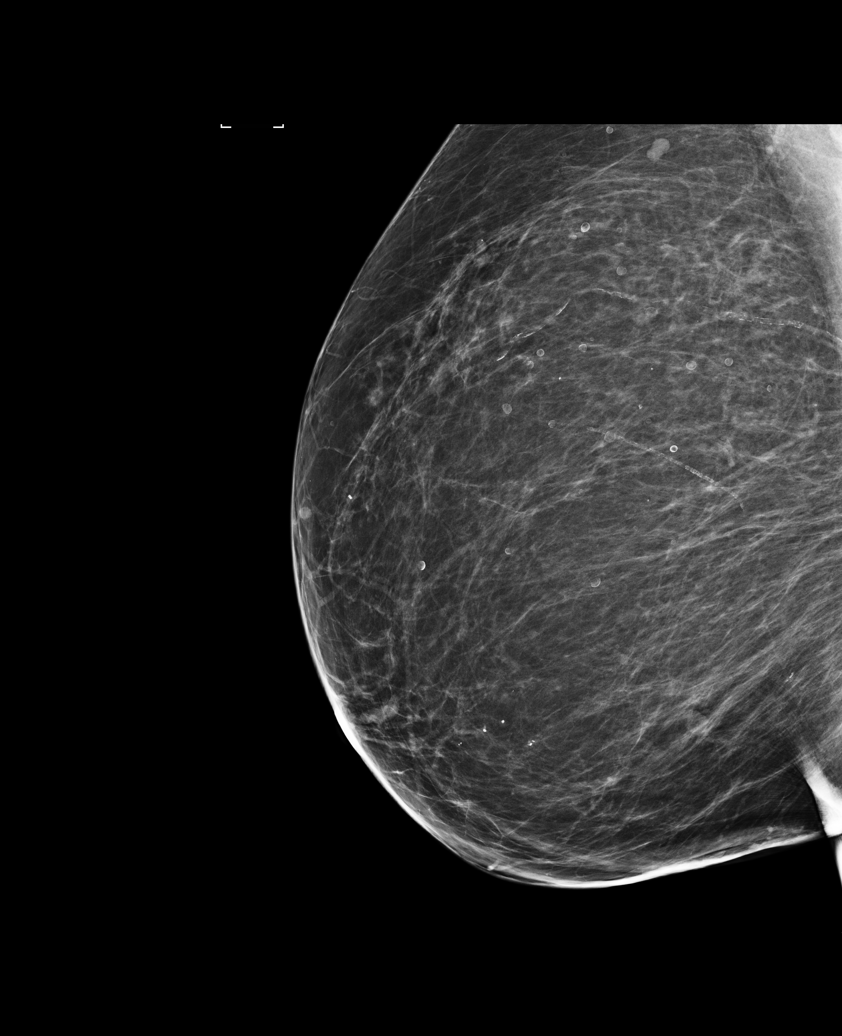

[R MLO tomo · tomo slice 36/71.0]
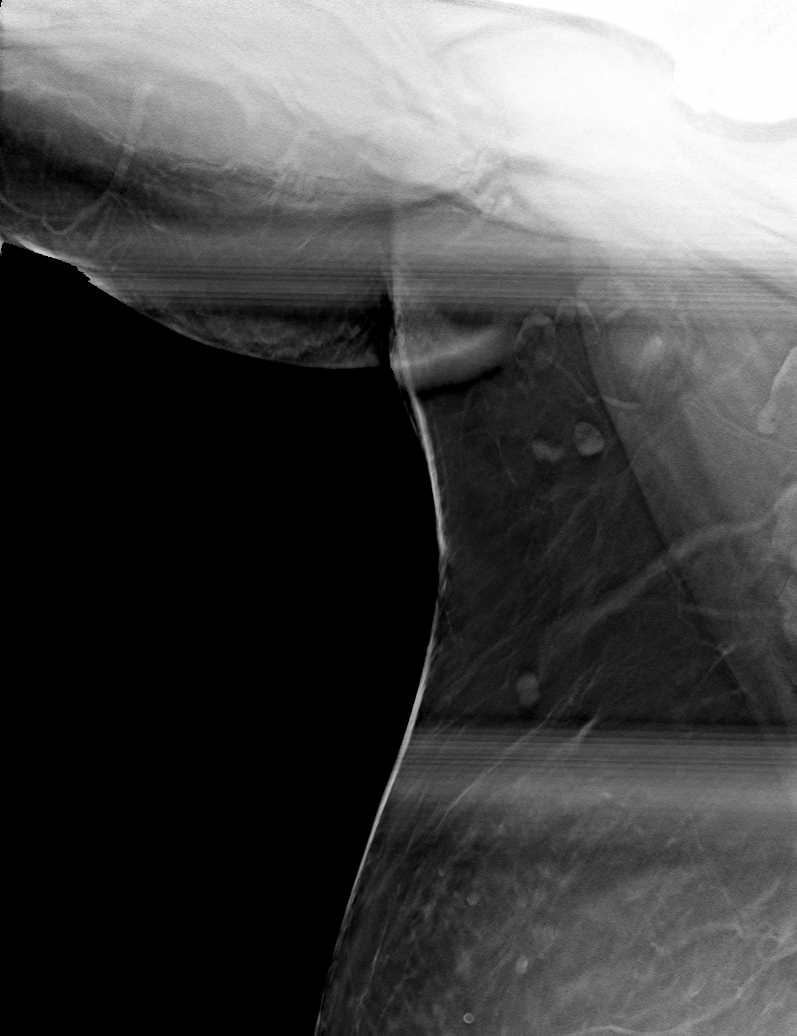

[R XCCL tomo · tomo slice 38/75.0]
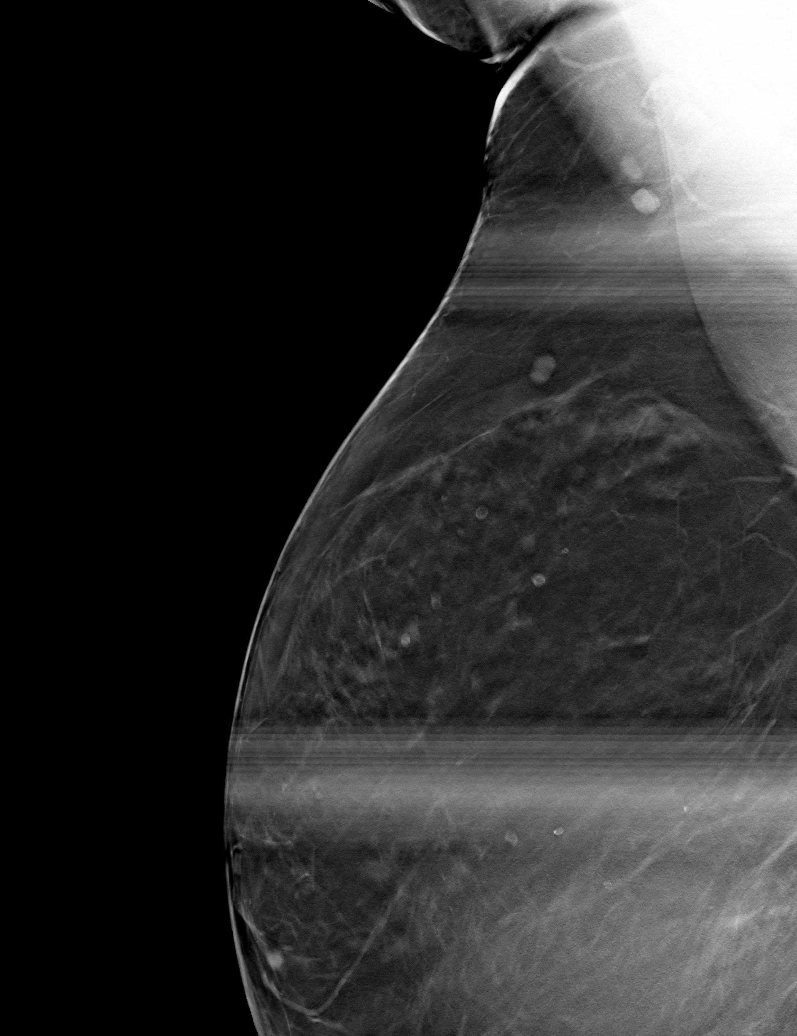

[R ML tomo · tomo slice 39/76.0]
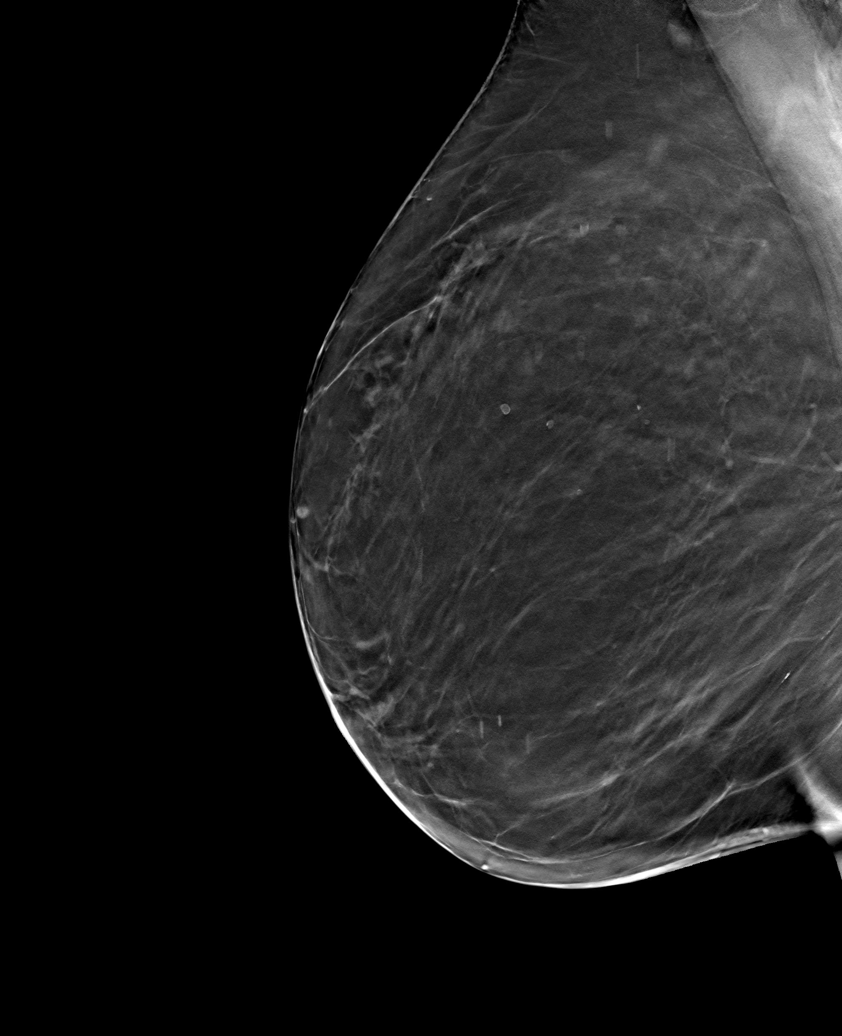

[6 of 18 positions shown; findings below may reference images not displayed]

ACR Breast Density Category b: There are scattered areas of
fibroglandular density.
FINDINGS: Additional mammographic views of the right breast demonstrate no
suspicious masses or areas of architectural distortion. The
previously seen nodule in the superior right breast, posterior
depth, corresponds to a benign intramammary lymph node.

Mammographic images were processed with CAD.
IMPRESSION: No mammographic evidence of malignancy in the right breast.

RECOMMENDATION:
Screening mammogram in one year.(Code:1Y-2-6Y4)

I have discussed the findings and recommendations with the patient.
Results were also provided in writing at the conclusion of the
visit. If applicable, a reminder letter will be sent to the patient
regarding the next appointment.

BI-RADS CATEGORY  2: Benign.

## 2017-07-09 IMAGING — XA DG CYSTOGRAM 3+V
2 series · 2 of 2 positions shown · non-contrast
Comparison: No recent.

CLINICAL DATA: Laparoscopic cyst ostomy repair.

EXAM:
RETROGRADE PYELOGRAM
TECHNIQUE: Intraoperative retrograde pyelogram obtained for review on today's
date.
FLUOROSCOPY TIME:  0 minutes 2 seconds fluoroscopy time. Two images.

[Series 1: cont. · 1 of 1 slices shown (1 of 2)]
[im 1/1]
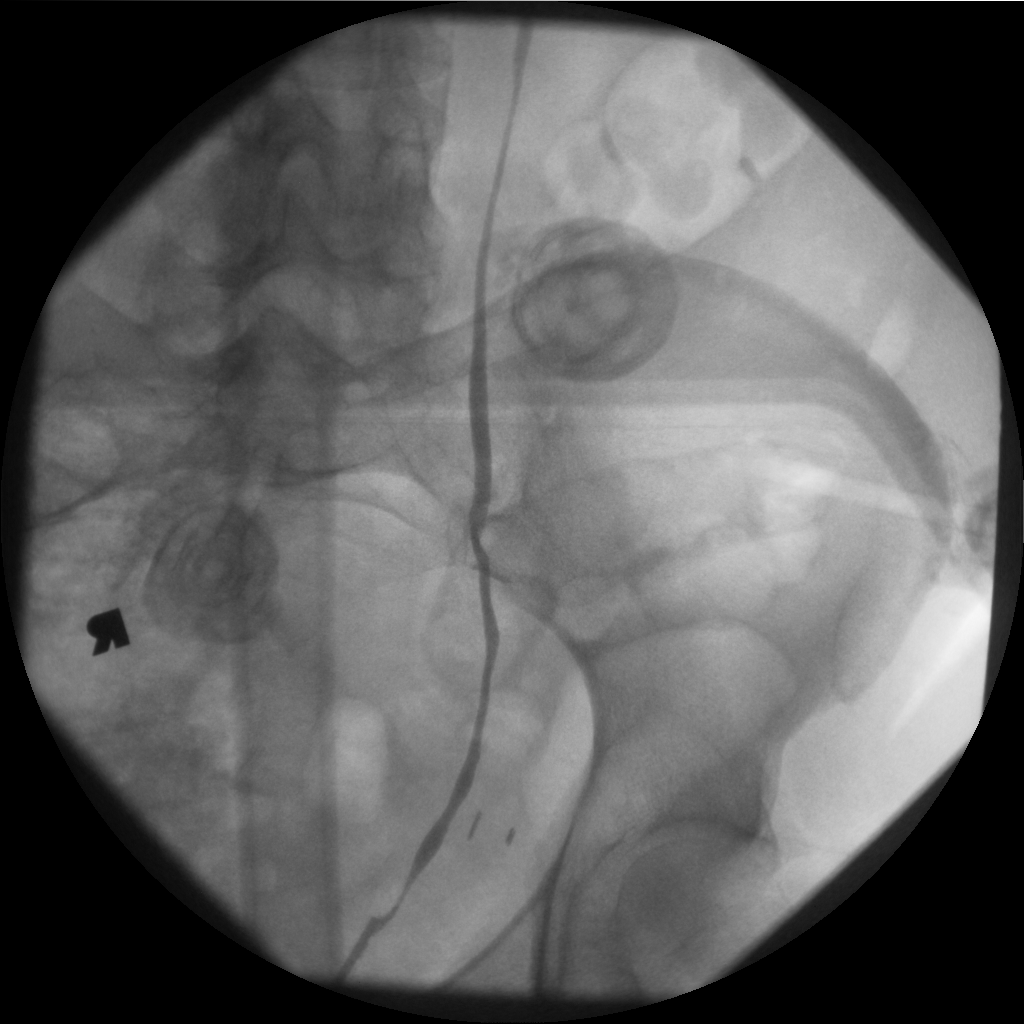

[Series 2: cont. · 1 of 1 slices shown (2 of 2)]
[im 1/1]
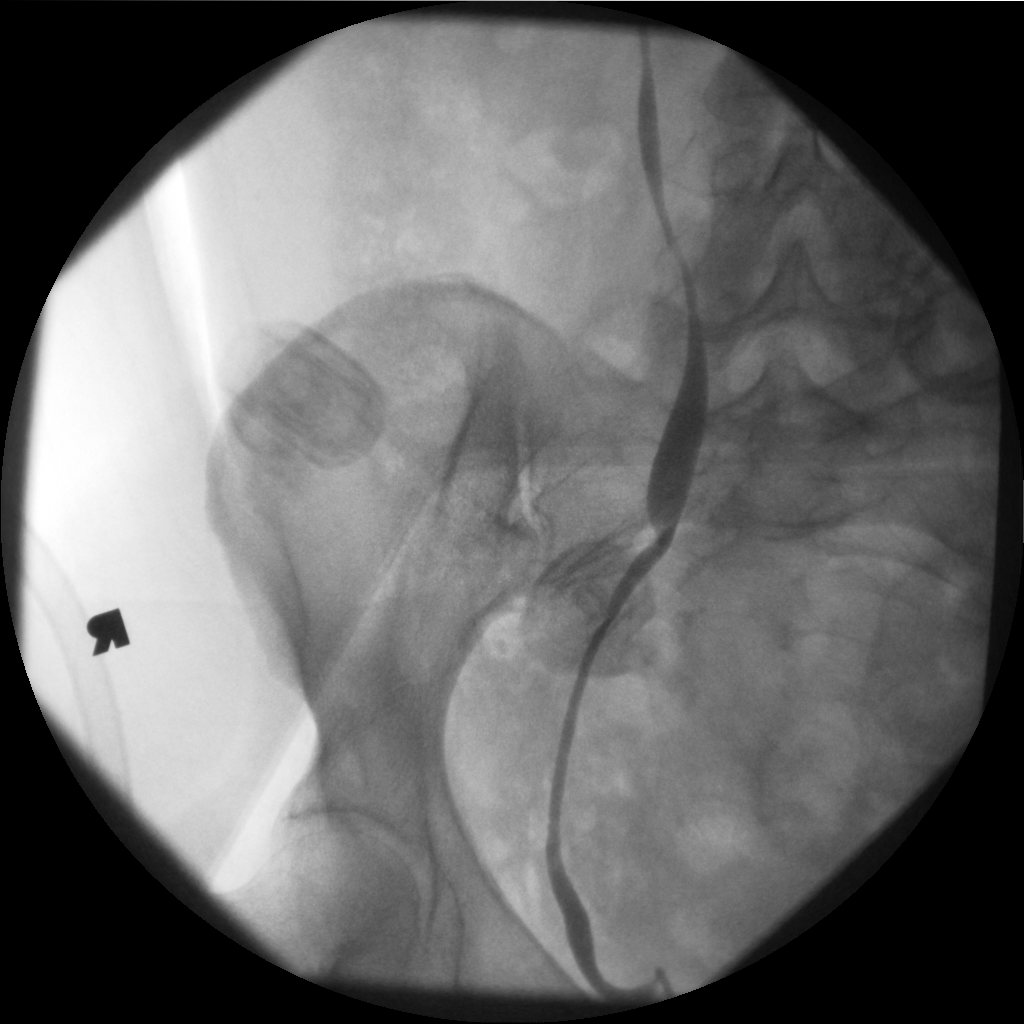

[2 of 2 positions shown; findings below may reference images not displayed]

FINDINGS: Contrast noted in nondilated distal ureters bilaterally. No filling
defects noted.
IMPRESSION: Widely patent distal ureters bilaterally. No evidence of distal
ureteral obstruction.

## 2017-10-05 ENCOUNTER — Other Ambulatory Visit: Payer: Self-pay | Admitting: Internal Medicine

## 2017-10-05 DIAGNOSIS — Z1231 Encounter for screening mammogram for malignant neoplasm of breast: Secondary | ICD-10-CM

## 2017-11-14 ENCOUNTER — Ambulatory Visit
Admission: RE | Admit: 2017-11-14 | Discharge: 2017-11-14 | Disposition: A | Discharge status: Home / Self Care | Payer: Medicare Other | Source: Ambulatory Visit | Attending: Internal Medicine | Admitting: Internal Medicine

## 2017-11-14 DIAGNOSIS — Z1231 Encounter for screening mammogram for malignant neoplasm of breast: Secondary | ICD-10-CM

## 2018-12-25 ENCOUNTER — Other Ambulatory Visit: Payer: Self-pay | Admitting: Internal Medicine

## 2018-12-25 DIAGNOSIS — Z1231 Encounter for screening mammogram for malignant neoplasm of breast: Secondary | ICD-10-CM

## 2019-02-09 ENCOUNTER — Ambulatory Visit
Admission: RE | Admit: 2019-02-09 | Discharge: 2019-02-09 | Disposition: A | Discharge status: Home / Self Care | Payer: Medicare Other | Source: Ambulatory Visit | Attending: Internal Medicine | Admitting: Internal Medicine

## 2019-02-09 ENCOUNTER — Other Ambulatory Visit: Payer: Self-pay

## 2019-02-09 DIAGNOSIS — Z1231 Encounter for screening mammogram for malignant neoplasm of breast: Secondary | ICD-10-CM

## 2019-05-31 ENCOUNTER — Ambulatory Visit: Payer: Medicare Other | Attending: Internal Medicine

## 2019-05-31 DIAGNOSIS — Z23 Encounter for immunization: Secondary | ICD-10-CM | POA: Insufficient documentation

## 2019-05-31 NOTE — Progress Notes (Signed)
   Covid-19 Vaccination Clinic  Name:  MARKEDA NIMMER    MRN: [id number] DOB: [birth year]  05/31/2019  Ms. Schamp was observed post Covid-19 immunization for 15 minutes without incidence. She was provided with Vaccine Information Sheet and instruction to access the V-Safe system.   Ms. Melanson was instructed to call 911 with any severe reactions post vaccine: Marland Kitchen Difficulty breathing  . Swelling of your face and throat  . A fast heartbeat  . A bad rash all over your body  . Dizziness and weakness    Immunizations Administered    Name Date Dose VIS Date Route   Pfizer COVID-19 Vaccine 05/31/2019  3:10 PM 0.3 mL 04/20/2019 Intramuscular   Manufacturer: [place]   Lot: [id number]   [place]: [id number]

## 2019-06-22 ENCOUNTER — Ambulatory Visit: Payer: Medicare Other | Attending: Internal Medicine

## 2019-06-22 DIAGNOSIS — Z23 Encounter for immunization: Secondary | ICD-10-CM | POA: Insufficient documentation

## 2019-06-22 NOTE — Progress Notes (Signed)
   Covid-19 Vaccination Clinic  Name:  Krystal [name]    MRN: [id number] DOB: 18-Jul-1950  06/22/2019  [name] was observed post Covid-19 immunization for 15 minutes without incidence. She was provided with Vaccine Information Sheet and instruction to access the V-Safe system.   [name] was instructed to call 911 with any severe reactions post vaccine: Marland Kitchen Difficulty breathing  . Swelling of your face and throat  . A fast heartbeat  . A bad rash all over your body  . Dizziness and weakness    Immunizations Administered    Name Date Dose VIS Date Route   Pfizer COVID-19 Vaccine 06/22/2019  8:18 AM 0.3 mL 04/20/2019 Intramuscular   Manufacturer: [place]   Lot: [id number]   [place]: [id number]

## 2019-07-02 ENCOUNTER — Ambulatory Visit: Payer: Medicare Other

## 2020-01-10 ENCOUNTER — Other Ambulatory Visit: Payer: Self-pay | Admitting: Internal Medicine

## 2020-01-10 DIAGNOSIS — Z1231 Encounter for screening mammogram for malignant neoplasm of breast: Secondary | ICD-10-CM

## 2020-02-14 ENCOUNTER — Ambulatory Visit
Admission: RE | Admit: 2020-02-14 | Discharge: 2020-02-14 | Disposition: A | Discharge status: Home / Self Care | Payer: Medicare Other | Source: Ambulatory Visit | Attending: Internal Medicine | Admitting: Internal Medicine

## 2020-02-14 ENCOUNTER — Other Ambulatory Visit: Payer: Self-pay | Admitting: Internal Medicine

## 2020-02-14 ENCOUNTER — Other Ambulatory Visit: Payer: Self-pay

## 2020-02-14 DIAGNOSIS — Z1231 Encounter for screening mammogram for malignant neoplasm of breast: Secondary | ICD-10-CM

## 2020-03-24 ENCOUNTER — Ambulatory Visit
Admission: RE | Admit: 2020-03-24 | Discharge: 2020-03-24 | Disposition: A | Discharge status: Home / Self Care | Payer: Medicare Other | Source: Ambulatory Visit | Attending: Internal Medicine | Admitting: Internal Medicine

## 2020-03-24 ENCOUNTER — Other Ambulatory Visit: Payer: Self-pay

## 2020-03-24 DIAGNOSIS — Z1231 Encounter for screening mammogram for malignant neoplasm of breast: Secondary | ICD-10-CM

## 2021-03-30 ENCOUNTER — Other Ambulatory Visit: Payer: Self-pay | Admitting: Internal Medicine

## 2021-03-30 DIAGNOSIS — Z1231 Encounter for screening mammogram for malignant neoplasm of breast: Secondary | ICD-10-CM

## 2021-04-07 ENCOUNTER — Ambulatory Visit
Admission: RE | Admit: 2021-04-07 | Discharge: 2021-04-07 | Disposition: A | Discharge status: Home / Self Care | Payer: Medicare Other | Source: Ambulatory Visit | Attending: Internal Medicine | Admitting: Internal Medicine

## 2021-04-07 DIAGNOSIS — Z1231 Encounter for screening mammogram for malignant neoplasm of breast: Secondary | ICD-10-CM

## 2021-04-15 ENCOUNTER — Encounter: Payer: Self-pay | Admitting: Internal Medicine

## 2021-05-26 ENCOUNTER — Encounter: Payer: Self-pay | Admitting: Internal Medicine

## 2021-05-26 ENCOUNTER — Ambulatory Visit: Payer: Medicare Other | Admitting: Internal Medicine

## 2021-05-26 VITALS — BP 160/76 | HR 82 | Ht 60.0 in | Wt 156.0 lb

## 2021-05-26 DIAGNOSIS — Z860100 Personal history of colon polyps, unspecified: Secondary | ICD-10-CM

## 2021-05-26 DIAGNOSIS — D509 Iron deficiency anemia, unspecified: Secondary | ICD-10-CM

## 2021-05-26 DIAGNOSIS — E538 Deficiency of other specified B group vitamins: Secondary | ICD-10-CM | POA: Diagnosis not present

## 2021-05-26 DIAGNOSIS — K219 Gastro-esophageal reflux disease without esophagitis: Secondary | ICD-10-CM | POA: Diagnosis not present

## 2021-05-26 MED ORDER — SUTAB 1479-225-188 MG PO TABS: 1.0000 | ORAL_TABLET | Freq: Once | ORAL | 0 refills | Status: AC

## 2021-05-26 NOTE — Progress Notes (Signed)
HISTORY OF PRESENT ILLNESS:  Krystal [name] is a 71 y.o. 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  The patient underwent colonoscopy in 2008 and was found to have a small tubular adenoma.  Follow-up colonoscopy August 2014 revealed a diminutive rectal polyp which was hyperplastic as well as moderate sigmoid diverticulosis.  Follow-up in 10 years recommended.  40 pages of outside medical records have been sent and reviewed.  In April 2022 the patient was found to be anemic with a hemoglobin of 9.3.  Low MCV of 75.5.  Normal comprehensive metabolic panel.  Stool Hemoccult testing was negative.  Iron studies were obtained and revealed iron deficiency with a ferritin level of 8 and a saturation of 8%.  As well she was B12 deficient with a level of 205.  Hemoglobin A1c level was 5.9.  Patient has donated blood in the past but not within the past 5 or 6 years.  She has intermittent reflux symptoms without dysphagia.  She is not on PPI.  She tells me that our office contacted her in the fall stating that she was referred for an appointment.  She was not sure as to why.  She communicated back to Krystal [name] office to find out that her labs of 7-monthearlier were abnormal and require GI evaluation.  Since November 2022 she has been on B12 replacement as well as iron therapy.  She has no other complaints.  REVIEW OF SYSTEMS:  All non-GI ROS negative unless otherwise stated in the HPI except for arthritis, sinus trouble  Past Medical History:  Diagnosis Date   Arthritis    right thumb - no meds   [place]   left breast   Diabetes mellitus without complication ([place]    type 2   Hearing loss    bilateral - no hearing aids   Hyperlipidemia    Hypertension    Personal history of chemotherapy     Personal history of radiation therapy     Past Surgical History:  Procedure Laterality Date   BREAST LUMPECTOMY Left 2005   BREAST SURGERY  2005   Left Br Lumpectomy   CESAREAN SECTION  2 times   chemo and radiation  2005-2006   for breast cancer/ 4 chemo treatments and 36 radiation treatments   COLONOSCOPY     x 2   CYSTOSCOPY N/A 11/25/2015   Procedure: CYSTOSCOPY;  Surgeon: WSanjuana Kava MD;  [facility];  Service: Gynecology;  Laterality: N/A;   LAPAROSCOPIC VAGINAL HYSTERECTOMY WITH SALPINGO OOPHORECTOMY Bilateral 11/25/2015   Procedure: LAPAROSCOPIC ASSISTED VAGINAL HYSTERECTOMY WITH BILATERAL SALPINGO OOPHORECTOMY, LAPARSCOPIC CYSTOTOMY REPAIR WITH BILATERAL RETROGRADES;  Surgeon: WSanjuana Kava MD;  [facility];  Service: Gynecology;  Laterality: Bilateral;   NASAL SINUS SURGERY     TONSILLECTOMY     TUBAL LIGATION     WISDOM TOOTH EXTRACTION      Social History Krystal [name] reports that she has never smoked. She has never used smokeless tobacco. She reports that she does not drink alcohol and does not use drugs.  family history includes Breast cancer in her maternal aunt; Heart disease in her father and mother.  No Known Allergies     PHYSICAL EXAMINATION: Vital signs: BP (!) 160/76    Pulse 82    Ht 5' (1.524 m)  Wt 156 lb (70.8 kg)    BMI 30.47 kg/m   Constitutional: generally well-appearing, no acute distress Psychiatric: alert and oriented x3, cooperative Eyes: extraocular movements intact, anicteric, conjunctiva pink Mouth: oral pharynx moist, no lesions Neck: supple no lymphadenopathy Cardiovascular: heart regular rate and rhythm, no murmur Lungs: clear to auscultation bilaterally Abdomen: soft, nontender, nondistended, no obvious ascites, no peritoneal signs, normal bowel sounds, no organomegaly Rectal: Deferred until colonoscopy Extremities: no clubbing, cyanosis, or lower extremity edema bilaterally Skin: no lesions on visible  extremities Neuro: No focal deficits.  Cranial nerves intact  ASSESSMENT:  1.  Iron deficiency anemia.  Rule out underlying GI mucosal lesion. Replacement 2.  GERD without alarm features.  Untreated 3.  B12 deficiency.  On replacement 4.  History of nonadvanced adenoma in 2008 and negative for neoplasia colonoscopy 2014 5.  Multiple medical problems including insulin requiring diabetes mellitus  PLAN:  1.  Reflux precautions 2.  Screening for celiac disease with tissue transglutaminase antibody IgA and serum IgA level 3.  Follow-up replacement therapies with CBC, ferritin, and B12 level 4.  Schedule upper endoscopy to evaluate iron deficiency anemia and chronic reflux.The nature of the procedure, as well as the risks, benefits, and alternatives were carefully and thoroughly reviewed with the patient. Ample time for discussion and questions allowed. The patient understood, was satisfied, and agreed to proceed.  5.  Schedule colonoscopy in a patient with adenomatous colon polyp history to evaluate iron deficiency anemia.The nature of the procedure, as well as the risks, benefits, and alternatives were carefully and thoroughly reviewed with the patient. Ample time for discussion and questions allowed. The patient understood, was satisfied, and agreed to proceed.  6.  Adjust diabetic medications the day of her procedure to avoid unwanted hypoglycemia 7.  May require PPI for symptom control and/or mucosal healing.  To be determined A total time of 65 minutes was spent preparing to see the patient, reviewing a myriad of tests and outside records, obtaining comprehensive history and performing comprehensive physical examination.  Counseling the patient regarding her multiple above listed issues.  Ordering multiple laboratories and multiple endoscopic procedures.  Finally, documenting clinical information in the health record

## 2021-05-26 NOTE — Patient Instructions (Signed)
If you are age 73 or older, your body mass index should be between 23-30. Your Body mass index is 30.47 kg/m. If this is out of the aforementioned range listed, please consider follow up with your Primary Care Provider.  If you are age 13 or younger, your body mass index should be between 19-25. Your Body mass index is 30.47 kg/m. If this is out of the aformentioned range listed, please consider follow up with your Primary Care Provider.   ________________________________________________________  The [facility] providers would like to encourage you to use [facility] to communicate with providers for non-urgent requests or questions.  Due to long hold times on the telephone, sending your provider a message by [facility] may be a faster and more efficient way to get a response.  Please allow 48 business hours for a response.  Please remember that this is for non-urgent requests.  _______________________________________________________  Krystal [name] have been scheduled for an endoscopy and colonoscopy. Please follow the written instructions given to you at your visit today. Please pick up your prep supplies at the pharmacy within the next 1-3 days. If you use inhalers (even only as needed), please bring them with you on the day of your procedure.

## 2021-05-28 ENCOUNTER — Encounter: Payer: Self-pay | Admitting: Internal Medicine

## 2021-06-01 ENCOUNTER — Other Ambulatory Visit: Payer: Self-pay

## 2021-06-01 ENCOUNTER — Other Ambulatory Visit (INDEPENDENT_AMBULATORY_CARE_PROVIDER_SITE_OTHER): Payer: Medicare Other

## 2021-06-01 DIAGNOSIS — D509 Iron deficiency anemia, unspecified: Secondary | ICD-10-CM

## 2021-06-01 DIAGNOSIS — Z86010 Personal history of colonic polyps: Secondary | ICD-10-CM

## 2021-06-01 DIAGNOSIS — K219 Gastro-esophageal reflux disease without esophagitis: Secondary | ICD-10-CM

## 2021-06-01 LAB — CBC WITH DIFFERENTIAL/PLATELET
Basophils Absolute: 0.1 10*3/uL (ref 0.0–0.1)
Basophils Relative: 1.2 % (ref 0.0–3.0)
Eosinophils Absolute: 0.2 10*3/uL (ref 0.0–0.7)
Eosinophils Relative: 3.8 % (ref 0.0–5.0)
HCT: 31.3 % — ABNORMAL LOW (ref 36.0–46.0)
Hemoglobin: 9.6 g/dL — ABNORMAL LOW (ref 12.0–15.0)
Lymphocytes Relative: 29.6 % (ref 12.0–46.0)
Lymphs Abs: 1.6 10*3/uL (ref 0.7–4.0)
MCHC: 30.6 g/dL (ref 30.0–36.0)
MCV: 72.2 fl — ABNORMAL LOW (ref 78.0–100.0)
Monocytes Absolute: 0.7 10*3/uL (ref 0.1–1.0)
Monocytes Relative: 12.5 % — ABNORMAL HIGH (ref 3.0–12.0)
Neutro Abs: 2.8 10*3/uL (ref 1.4–7.7)
Neutrophils Relative %: 52.9 % (ref 43.0–77.0)
Platelets: 528 10*3/uL — ABNORMAL HIGH (ref 150.0–400.0)
RBC: 4.33 Mil/uL (ref 3.87–5.11)
RDW: 20.9 % — ABNORMAL HIGH (ref 11.5–15.5)
WBC: 5.3 10*3/uL (ref 4.0–10.5)

## 2021-06-01 LAB — VITAMIN B12: Vitamin B-12: 328 pg/mL (ref 211–911)

## 2021-06-01 LAB — FERRITIN: Ferritin: 3.5 ng/mL — ABNORMAL LOW (ref 10.0–291.0)

## 2021-06-01 MED ORDER — FERROUS SULFATE 325 (65 FE) MG PO TBEC: 325.0000 mg | DELAYED_RELEASE_TABLET | Freq: Two times a day (BID) | ORAL | 3 refills | Status: DC

## 2021-06-02 LAB — IGA: Immunoglobulin A: 243 mg/dL (ref 70–320)

## 2021-06-02 LAB — TISSUE TRANSGLUTAMINASE, IGA: (tTG) Ab, IgA: <1 U/mL

## 2021-06-29 ENCOUNTER — Telehealth: Payer: Self-pay | Admitting: Internal Medicine

## 2021-06-29 NOTE — Telephone Encounter (Signed)
Inbound call from patient states she have spoken Dr. Reynaldo Minium and he insist she is to leave her insulin box on and not take it off day of procedure.

## 2021-07-01 NOTE — Telephone Encounter (Signed)
Lm on vm 

## 2021-07-08 ENCOUNTER — Encounter: Payer: Self-pay | Admitting: Internal Medicine

## 2021-07-14 ENCOUNTER — Other Ambulatory Visit (INDEPENDENT_AMBULATORY_CARE_PROVIDER_SITE_OTHER): Payer: Medicare Other

## 2021-07-14 ENCOUNTER — Ambulatory Visit (AMBULATORY_SURGERY_CENTER): Payer: Medicare Other | Admitting: Internal Medicine

## 2021-07-14 ENCOUNTER — Encounter: Payer: Self-pay | Admitting: Internal Medicine

## 2021-07-14 ENCOUNTER — Other Ambulatory Visit: Payer: Self-pay

## 2021-07-14 ENCOUNTER — Other Ambulatory Visit: Payer: Self-pay | Admitting: Internal Medicine

## 2021-07-14 ENCOUNTER — Telehealth: Payer: Self-pay | Admitting: Internal Medicine

## 2021-07-14 VITALS — BP 135/56 | HR 61 | Temp 98.3°F | Resp 15 | Ht 60.0 in | Wt 156.0 lb

## 2021-07-14 DIAGNOSIS — D509 Iron deficiency anemia, unspecified: Secondary | ICD-10-CM

## 2021-07-14 DIAGNOSIS — K5730 Diverticulosis of large intestine without perforation or abscess without bleeding: Secondary | ICD-10-CM | POA: Diagnosis not present

## 2021-07-14 DIAGNOSIS — K449 Diaphragmatic hernia without obstruction or gangrene: Secondary | ICD-10-CM

## 2021-07-14 DIAGNOSIS — K648 Other hemorrhoids: Secondary | ICD-10-CM

## 2021-07-14 DIAGNOSIS — K219 Gastro-esophageal reflux disease without esophagitis: Secondary | ICD-10-CM | POA: Diagnosis not present

## 2021-07-14 LAB — CBC WITH DIFFERENTIAL/PLATELET
Basophils Absolute: 0.1 10*3/uL (ref 0.0–0.1)
Basophils Relative: 1.4 % (ref 0.0–3.0)
Eosinophils Absolute: 0.1 10*3/uL (ref 0.0–0.7)
Eosinophils Relative: 2.5 % (ref 0.0–5.0)
HCT: 29.5 % — ABNORMAL LOW (ref 36.0–46.0)
Hemoglobin: 9.3 g/dL — ABNORMAL LOW (ref 12.0–15.0)
Lymphocytes Relative: 23.4 % (ref 12.0–46.0)
Lymphs Abs: 1 10*3/uL (ref 0.7–4.0)
MCHC: 31.6 g/dL (ref 30.0–36.0)
MCV: 73.7 fl — ABNORMAL LOW (ref 78.0–100.0)
Monocytes Absolute: 0.5 10*3/uL (ref 0.1–1.0)
Monocytes Relative: 13 % — ABNORMAL HIGH (ref 3.0–12.0)
Neutro Abs: 2.5 10*3/uL (ref 1.4–7.7)
Neutrophils Relative %: 59.7 % (ref 43.0–77.0)
Platelets: 446 10*3/uL — ABNORMAL HIGH (ref 150.0–400.0)
RBC: 4 Mil/uL (ref 3.87–5.11)
RDW: 20.2 % — ABNORMAL HIGH (ref 11.5–15.5)
WBC: 4.1 10*3/uL (ref 4.0–10.5)

## 2021-07-14 LAB — FERRITIN: Ferritin: 5.8 ng/mL — ABNORMAL LOW (ref 10.0–291.0)

## 2021-07-14 MED ORDER — SODIUM CHLORIDE 0.9 % IV SOLN: 500.0000 mL | Freq: Once | INTRAVENOUS | Status: DC

## 2021-07-14 NOTE — Patient Instructions (Signed)
? ?To lab on discharge today for CBC & ferritin  ? ?Capsule Endoscopy will be scheduled by Dr Blanch Media office and they will call toy with date and time  ? ?Handouts on diverticulosis & hemorrhoids given to you today ? ?Await pathology results on Duodenal biopsies done ?   ?Continue current medications including Iron twice daily ? ? ? ?YOU HAD AN ENDOSCOPIC PROCEDURE TODAY AT [facility]:   Refer to the procedure report that was given to you for any specific questions about what was found during the examination.  If the procedure report does not answer your questions, please call your gastroenterologist to clarify.  If you requested that your care partner not be given the details of your procedure findings, then the procedure report has been included in a sealed envelope for you to review at your convenience later. ? ?YOU SHOULD EXPECT: Some feelings of bloating in the abdomen. Passage of more gas than usual.  Walking can help get rid of the air that was put into your GI tract during the procedure and reduce the bloating. If you had a lower endoscopy (such as a colonoscopy or flexible sigmoidoscopy) you may notice spotting of blood in your stool or on the toilet paper. If you underwent a bowel prep for your procedure, you may not have a normal bowel movement for a few days. ? ?Please Note:  You might notice some irritation and congestion in your nose or some drainage.  This is from the oxygen used during your procedure.  There is no need for concern and it should clear up in a day or so. ? ?SYMPTOMS TO REPORT IMMEDIATELY: ? ?Following lower endoscopy (colonoscopy or flexible sigmoidoscopy): ? Excessive amounts of blood in the stool ? Significant tenderness or worsening of abdominal pains ? Swelling of the abdomen that is new, acute ? Fever of 100?F or higher ? ?Following upper endoscopy (EGD) ? Vomiting of blood or coffee ground material ? New chest pain or pain under the shoulder blades ? Painful or  persistently difficult swallowing ? New shortness of breath ? Fever of 100?F or higher ? Black, tarry-looking stools ? ?For urgent or emergent issues, a gastroenterologist can be reached at any hour by calling [phone]. ?Do not use MyChart messaging for urgent concerns.  ? ? ?DIET:  We do recommend a small meal at first, but then you may proceed to your regular diet.  Drink plenty of fluids but you should avoid alcoholic beverages for 24 hours. ? ?ACTIVITY:  You should plan to take it easy for the rest of today and you should NOT DRIVE or use heavy machinery until tomorrow (because of the sedation medicines used during the test).   ? ?FOLLOW UP: ?Our staff will call the number listed on your records 48-72 hours following your procedure to check on you and address any questions or concerns that you may have regarding the information given to you following your procedure. If we do not reach you, we will leave a message.  We will attempt to reach you two times.  During this call, we will ask if you have developed any symptoms of COVID 19. If you develop any symptoms (ie: fever, flu-like symptoms, shortness of breath, cough etc.) before then, please call [phone].  If you test positive for Covid 19 in the 2 weeks post procedure, please call and report this information to [place].   ? ?If any biopsies were taken you will be contacted by phone  or by letter within the next 1-3 weeks.  Please call us at [phone] if you have not heard about the biopsies in 3 weeks.  ? ? ?SIGNATURES/CONFIDENTIALITY: ?You and/or your care partner have signed paperwork which will be entered into your electronic medical record.  These signatures attest to the fact that that the information above on your After Visit Summary has been reviewed and is understood.  Full responsibility of the confidentiality of this discharge information lies with you and/or your care-partner.  ? ? ?

## 2021-07-14 NOTE — Telephone Encounter (Signed)
See result note.  

## 2021-07-14 NOTE — Op Note (Signed)
[place] ?Patient Name: Krystal [name] ?Procedure Date: 07/14/2021 7:23 AM ?MRN: [id number] ?Endoscopist: Docia Chuck. Henrene Pastor , MD ?Age: 71 ?Referring MD:  ?Date of Birth: 1950/08/14 ?Gender: Female ?Account #: [id number] ?Procedure:                Upper GI endoscopy with biopsies ?Indications:              Iron deficiency anemia, Esophageal reflux ?Medicines:                Monitored Anesthesia Care ?Procedure:                Pre-Anesthesia Assessment: ?                          - Prior to the procedure, a History and Physical  ?                          was performed, and patient medications and  ?                          allergies were reviewed. The patient's tolerance of  ?                          previous anesthesia was also reviewed. The risks  ?                          and benefits of the procedure and the sedation  ?                          options and risks were discussed with the patient.  ?                          All questions were answered, and informed consent  ?                          was obtained. Prior Anticoagulants: The patient has  ?                          taken no previous anticoagulant or antiplatelet  ?                          agents. ASA Grade Assessment: III - A patient with  ?                          severe systemic disease. After reviewing the risks  ?                          and benefits, the patient was deemed in  ?                          satisfactory condition to undergo the procedure. ?                          After obtaining informed consent, the endoscope was  ?  passed under direct vision. Throughout the  ?                          procedure, the patient's blood pressure, pulse, and  ?                          oxygen saturations were monitored continuously. The  ?                          Endoscope was introduced through the mouth, and  ?                          advanced to the third part of duodenum. The upper  ?                           GI endoscopy was accomplished without difficulty.  ?                          The patient tolerated the procedure well. ?Scope In: ?Scope Out: ?Findings:                 The esophagus was normal. ?                          The stomach was normal save a sliding hiatal  ?                          hernia. No erosions. ?                          The examined duodenum was normal. Biopsies for  ?                          histology were taken with a cold forceps for  ?                          evaluation of celiac disease. ?                          The cardia and gastric fundus were normal on  ?                          retroflexion. ?Complications:            No immediate complications. ?Estimated Blood Loss:     Estimated blood loss: none. ?Impression:               - Normal esophagus. ?                          - Normal stomach. Hiatal hernia without erosions. ?                          - Normal examined duodenum. Biopsied. ?Recommendation:           - Patient has a contact number available for  ?  emergencies. The signs and symptoms of potential  ?                          delayed complications were discussed with the  ?                          patient. Return to normal activities tomorrow.  ?                          Written discharge instructions were provided to the  ?                          patient. ?                          - Resume previous diet. ?                          - Continue present medications, including iron  ?                          twice daily. ?                          - Await pathology results. ?                          -Please send the patient to the laboratory this  ?                          morning for a CBC and ferritin ?                          -SCHEDULE CAPSULE ENDOSCOPY "iron deficiency  ?                          anemia, rule out small bowel lesion, negative ECL" ?Docia Chuck. Henrene Pastor, MD ?07/14/2021 8:46:06 AM ?This report has been signed electronically. ?

## 2021-07-14 NOTE — Telephone Encounter (Signed)
Patient returned your call, please advise. 

## 2021-07-14 NOTE — Op Note (Signed)
[place] ?Patient Name: Krystal [name] ?Procedure Date: 07/14/2021 7:24 AM ?MRN: [id number] ?Endoscopist: Docia Chuck. Henrene Pastor , MD ?Age: 71 ?Referring MD:  ?Date of Birth: 30-Dec-1950 ?Gender: Female ?Account #: [id number] ?Procedure:                Colonoscopy ?Indications:              Iron deficiency anemia. Also history of nonadvanced  ?                          adenoma. Previous colonoscopy examinations 2008,  ?                          2014 ?Medicines:                Monitored Anesthesia Care ?Procedure:                Pre-Anesthesia Assessment: ?                          - Prior to the procedure, a History and Physical  ?                          was performed, and patient medications and  ?                          allergies were reviewed. The patient's tolerance of  ?                          previous anesthesia was also reviewed. The risks  ?                          and benefits of the procedure and the sedation  ?                          options and risks were discussed with the patient.  ?                          All questions were answered, and informed consent  ?                          was obtained. Prior Anticoagulants: The patient has  ?                          taken no previous anticoagulant or antiplatelet  ?                          agents. ASA Grade Assessment: III - A patient with  ?                          severe systemic disease. After reviewing the risks  ?                          and benefits, the patient was deemed in  ?  satisfactory condition to undergo the procedure. ?                          After obtaining informed consent, the colonoscope  ?                          was passed under direct vision. Throughout the  ?                          procedure, the patient's blood pressure, pulse, and  ?                          oxygen saturations were monitored continuously. The  ?                          CF [id number] #[id number] was introduced through the anus  ?                           and advanced to the the cecum, identified by  ?                          appendiceal orifice and ileocecal valve. The  ?                          terminal ileum, ileocecal valve, appendiceal  ?                          orifice, and rectum were photographed. The quality  ?                          of the bowel preparation was excellent. The  ?                          colonoscopy was performed without difficulty. The  ?                          patient tolerated the procedure well. The bowel  ?                          preparation used was SUPREP via split dose  ?                          instruction. ?Scope In: 8:17:12 AM ?Scope Out: 8:28:33 AM ?Scope Withdrawal Time: 0 hours 9 minutes 13 seconds  ?Total Procedure Duration: 0 hours 11 minutes 21 seconds  ?Findings:                 The terminal ileum appeared normal. ?                          Multiple diverticula were found in the sigmoid  ?                          colon. Small internal hemorrhoids. ?  The exam was otherwise without abnormality on  ?                          direct and retroflexion views. ?Complications:            No immediate complications. Estimated blood loss:  ?                          None. ?Estimated Blood Loss:     Estimated blood loss: none. ?Impression:               - Diverticulosis in the sigmoid colon. Normal  ?                          terminal ileum. Small internal hemorrhoids ?                          - The examination was otherwise normal on direct  ?                          and retroflexion views. ?                          - No specimens collected. ?Recommendation:           - Repeat colonoscopy is not recommended for  ?                          surveillance. ?                          - Patient has a contact number available for  ?                          emergencies. The signs and symptoms of potential  ?                          delayed complications were discussed with the   ?                          patient. Return to normal activities tomorrow.  ?                          Written discharge instructions were provided to the  ?                          patient. ?                          - Resume previous diet. ?                          - Continue present medications. ?                          -EGD today. Please see report ?Docia Chuck. Henrene Pastor, MD ?07/14/2021 8:36:05 AM ?This report has been signed electronically. ?

## 2021-07-14 NOTE — Progress Notes (Signed)
HISTORY OF PRESENT ILLNESS: ?  ?Krystal [name] is a 71 y.o. 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 Reynaldo [name], regarding GERD, iron deficiency anemia, and history of colon polyps.  The patient underwent colonoscopy in 2008 and was found to have a small tubular adenoma.  Follow-up colonoscopy August 2014 revealed a diminutive rectal polyp which was hyperplastic as well as moderate sigmoid diverticulosis.  Follow-up in 10 years recommended.  40 pages of outside medical records have been sent and reviewed.  In April 2022 the patient was found to be anemic with a hemoglobin of 9.3.  Low MCV of 75.5.  Normal comprehensive metabolic panel.  Stool Hemoccult testing was negative.  Iron studies were obtained and revealed iron deficiency with a ferritin level of 8 and a saturation of 8%.  As well she was B12 deficient with a level of 205.  Hemoglobin A1c level was 5.9.  Patient has donated blood in the past but not within the past 5 or 6 years.  She has intermittent reflux symptoms without dysphagia.  She is not on PPI.  She tells me that our office contacted her in the fall stating that she was referred for an appointment.  She was not sure as to why.  She communicated back to Dr. Jacquiline [name] office to find out that her labs of 64-monthearlier were abnormal and require GI evaluation.  Since November 2022 she has been on B12 replacement as well as iron therapy.  She has no other complaints. ?  ?REVIEW OF SYSTEMS: ?  ?All non-GI ROS negative unless otherwise stated in the HPI except for arthritis, sinus trouble ?  ?    ?Past Medical History:  ?Diagnosis Date  ? Arthritis    ?  right thumb - no meds  ? [place] [place]  ?  left breast  ? Diabetes mellitus without complication ([facility]    ?  type 2  ? Hearing loss    ?  bilateral - no hearing aids  ? Hyperlipidemia    ? Hypertension    ? Personal history of  chemotherapy    ? Personal history of radiation therapy    ?  ?  ?     ?Past Surgical History:  ?Procedure Laterality Date  ? BREAST LUMPECTOMY Left 2005  ? BREAST SURGERY   2005  ?  Left Br Lumpectomy  ? CESAREAN SECTION   2 times  ? chemo and radiation   2005-2006  ?  for breast cancer/ 4 chemo treatments and 36 radiation treatments  ? COLONOSCOPY      ?  x 2  ? CYSTOSCOPY N/A 11/25/2015  ?  Procedure: CYSTOSCOPY;  Surgeon: Krystal Kava MD;  [facility];  Service: Gynecology;  Laterality: N/A;  ? LAPAROSCOPIC VAGINAL HYSTERECTOMY WITH SALPINGO OOPHORECTOMY Bilateral 11/25/2015  ?  Procedure: LAPAROSCOPIC ASSISTED VAGINAL HYSTERECTOMY WITH BILATERAL SALPINGO OOPHORECTOMY, LAPARSCOPIC CYSTOTOMY REPAIR WITH BILATERAL RETROGRADES;  Surgeon: Krystal Kava MD;  [facility];  Service: Gynecology;  Laterality: Bilateral;  ? NASAL SINUS SURGERY      ? TONSILLECTOMY      ? TUBAL LIGATION      ? WISDOM TOOTH EXTRACTION      ?  ?  ?Social History ?SWillia [name]  reports that she has never smoked. She has never used smokeless tobacco. She reports that she does not drink alcohol and does not use drugs. ?  ?  family history includes Breast cancer in her maternal aunt; Heart disease in her father and mother. ?  ?No Known Allergies ?  ?  ?  ?PHYSICAL EXAMINATION: ?Vital signs: BP (!) 160/76   Pulse 82   Ht 5' (1.524 m)   Wt 156 lb (70.8 kg)   BMI 30.47 kg/m?   ?Constitutional: generally well-appearing, no acute distress ?Psychiatric: alert and oriented x3, cooperative ?Eyes: extraocular movements intact, anicteric, conjunctiva pink ?Mouth: oral pharynx moist, no lesions ?Neck: supple no lymphadenopathy ?Cardiovascular: heart regular rate and rhythm, no murmur ?Lungs: clear to auscultation bilaterally ?Abdomen: soft, nontender, nondistended, no obvious ascites, no peritoneal signs, normal bowel sounds, no organomegaly ?Rectal: Deferred until colonoscopy ?Extremities: no clubbing, cyanosis, or lower extremity edema  bilaterally ?Skin: no lesions on visible extremities ?Neuro: No focal deficits.  Cranial nerves intact ?  ?ASSESSMENT: ?  ?1.  Iron deficiency anemia.  Rule out underlying GI mucosal lesion. ?Replacement ?2.  GERD without alarm features.  Untreated ?3.  B12 deficiency.  On replacement ?4.  History of nonadvanced adenoma in 2008 and negative for neoplasia colonoscopy 2014 ?5.  Multiple medical problems including insulin requiring diabetes mellitus ?  ?PLAN: ?  ?1.  Reflux precautions ?2.  Screening for celiac disease with tissue transglutaminase antibody IgA and serum IgA level ?3.  Follow-up replacement therapies with CBC, ferritin, and B12 level ?4.  Schedule upper endoscopy to evaluate iron deficiency anemia and chronic reflux.The nature of the procedure, as well as the risks, benefits, and alternatives were carefully and thoroughly reviewed with the patient. Ample time for discussion and questions allowed. The patient understood, was satisfied, and agreed to proceed.  ?5.  Schedule colonoscopy in a patient with adenomatous colon polyp history to evaluate iron deficiency anemia.The nature of the procedure, as well as the risks, benefits, and alternatives were carefully and thoroughly reviewed with the patient. Ample time for discussion and questions allowed. The patient understood, was satisfied, and agreed to proceed.  ?6.  Adjust diabetic medications the day of her procedure to avoid unwanted hypoglycemia ?7.  May require PPI for symptom control and/or mucosal healing.  To be determined ? ?Patient was seen June 01, 2021 regarding iron deficiency anemia and GERD.  See that dictation as outlined above.  No interval clinical changes per patient this morning.   ?

## 2021-07-14 NOTE — Progress Notes (Signed)
Sedate, gd SR, tolerated procedure well, VSS, report to RN 

## 2021-07-14 NOTE — Progress Notes (Signed)
Called to room to assist during endoscopic procedure.  Patient ID and intended procedure confirmed with present staff. Received instructions for my participation in the procedure from the performing physician.  

## 2021-07-14 NOTE — Progress Notes (Signed)
Received a call from [facility] in [place] indicating that Dr Henrene Pastor would like CBC and Ferritin drawn today for iron deficiency anemia diagnosis. Labs entered in Krystal [name]. ?

## 2021-07-15 ENCOUNTER — Other Ambulatory Visit: Payer: Self-pay

## 2021-07-15 DIAGNOSIS — D509 Iron deficiency anemia, unspecified: Secondary | ICD-10-CM

## 2021-07-16 ENCOUNTER — Telehealth: Payer: Self-pay | Admitting: Nurse Practitioner

## 2021-07-16 ENCOUNTER — Telehealth: Payer: Self-pay | Admitting: *Deleted

## 2021-07-16 ENCOUNTER — Other Ambulatory Visit: Payer: Self-pay | Admitting: *Deleted

## 2021-07-16 DIAGNOSIS — D509 Iron deficiency anemia, unspecified: Secondary | ICD-10-CM

## 2021-07-16 NOTE — Progress Notes (Signed)
New pt appt for 4/17 with labs two days prior. Lab orders entered ?

## 2021-07-16 NOTE — Telephone Encounter (Signed)
?  Follow up Call- ? ?Call back number 07/14/2021  ?Post procedure Call Back phone  # [phone]  ?Permission to leave phone message Yes  ?Some recent data might be hidden  ?  ? ?Patient questions: ? ?Do you have a fever, pain , or abdominal swelling? No. ?Pain Score  0 * ? ?Have you tolerated food without any problems? Yes.   ? ?Have you been able to return to your normal activities? Yes.   ? ?Do you have any questions about your discharge instructions: ?Diet   No. ?Medications  No. ?Follow up visit  No. ? ?Do you have questions or concerns about your Care? No. ? ?Actions: ?* If pain score is 4 or above: ?No action needed, pain <4. ? ? ?

## 2021-07-16 NOTE — Telephone Encounter (Signed)
Contacted patient and she is scheduled for lab and initial office visit. She is aware of dates and times. ?

## 2021-07-17 ENCOUNTER — Encounter: Payer: Self-pay | Admitting: Internal Medicine

## 2021-08-03 ENCOUNTER — Ambulatory Visit (INDEPENDENT_AMBULATORY_CARE_PROVIDER_SITE_OTHER): Payer: Medicare Other | Admitting: Internal Medicine

## 2021-08-03 ENCOUNTER — Encounter: Payer: Self-pay | Admitting: Internal Medicine

## 2021-08-03 DIAGNOSIS — D509 Iron deficiency anemia, unspecified: Secondary | ICD-10-CM

## 2021-08-03 NOTE — Patient Instructions (Signed)
You may have clear liquids beginning at 10:30 am after ingesting the capsule.    You can have a light lunch at 12:30 pm; sandwich and half bowl of soup.  Return to the office at 4 pm to return the equipment.   Return to you normal diet at 5 pm.   Call [phone] and ask for Latoyna Hird BSN RN if you have any questions.  You should pass the capsule in your stool 8-48 hours after ingestion. If you have not passed the capsule, after 72 hours, please contact the office at [phone].    

## 2021-08-03 NOTE — Progress Notes (Signed)
SN: [id number] ?Exp: 09-14-2022 ?LOT: [id number] ? ?Patient arrived for VCE. Reported the prep went well. This RN explained capsule dietary restrictions for the next few hours. Pt advised to return at 4 pm to return capsule equipment.  Patient verbalized understanding. Opened capsule, ensured capsule was flashing prior to the patient swallowing the capsule. Patient swallowed capsule without difficulty.  Patient told to call the office with any questions and if capsule has not passed after 72 hours. No further questions by the conclusion of the visit.   ?

## 2021-08-11 ENCOUNTER — Telehealth: Payer: Self-pay

## 2021-08-11 ENCOUNTER — Other Ambulatory Visit: Payer: Self-pay

## 2021-08-11 DIAGNOSIS — D509 Iron deficiency anemia, unspecified: Secondary | ICD-10-CM

## 2021-08-11 NOTE — Telephone Encounter (Signed)
-----   Message from Alfredia Ferguson, PA-C sent at 08/10/2021  5:10 PM EDT ----- ?Regarding: labs and CT ?Vaughan Basta, this patient had a capsule endoscopy on 08/03/2021, patient of Dr. Blanch Media ? ?She has findings of inflammatory changes and multiple ulcerations in her small bowel-etiology not clear, rule out IBD versus other. ? ?I called her this evening and went over the findings with her. ? ?She needs to be scheduled for a CT enterography, and also needs to come back to the office for a sed rate, CRP, and fecal calprotectin. ?Please put the orders under Dr. Henrene Pastor. ? ?She is scheduled for an iron infusion through hematology on 08/24/2021 so do not schedule the CT on that day.  She also mentions that she has a Chief Financial Officer which is like an insulin pump but movable, I do not think that should be an issue for the CT scan ?I told the patient you would be in touch with her tomorrow- thank you ? ?

## 2021-08-11 NOTE — Telephone Encounter (Signed)
Lab orders in epic. Order in for enterography, radiology scheduling to contact pt to set up the scan. ?

## 2021-08-13 ENCOUNTER — Other Ambulatory Visit (INDEPENDENT_AMBULATORY_CARE_PROVIDER_SITE_OTHER): Payer: Medicare Other

## 2021-08-13 DIAGNOSIS — D509 Iron deficiency anemia, unspecified: Secondary | ICD-10-CM

## 2021-08-13 LAB — C-REACTIVE PROTEIN: CRP: 2.2 mg/dL (ref 0.5–20.0)

## 2021-08-13 LAB — SEDIMENTATION RATE: Sed Rate: 101 mm/hr — ABNORMAL HIGH (ref 0–30)

## 2021-08-17 ENCOUNTER — Other Ambulatory Visit: Payer: Medicare Other

## 2021-08-17 DIAGNOSIS — D509 Iron deficiency anemia, unspecified: Secondary | ICD-10-CM

## 2021-08-20 LAB — CALPROTECTIN, FECAL: Calprotectin, Fecal: 688 ug/g — ABNORMAL HIGH (ref 0–120)

## 2021-08-21 ENCOUNTER — Inpatient Hospital Stay: Payer: Medicare Other | Attending: Oncology

## 2021-08-21 DIAGNOSIS — D509 Iron deficiency anemia, unspecified: Secondary | ICD-10-CM | POA: Diagnosis not present

## 2021-08-21 DIAGNOSIS — E119 Type 2 diabetes mellitus without complications: Secondary | ICD-10-CM | POA: Diagnosis not present

## 2021-08-21 DIAGNOSIS — E538 Deficiency of other specified B group vitamins: Secondary | ICD-10-CM | POA: Diagnosis not present

## 2021-08-21 DIAGNOSIS — Z853 Personal history of malignant neoplasm of breast: Secondary | ICD-10-CM | POA: Diagnosis not present

## 2021-08-21 DIAGNOSIS — R82998 Other abnormal findings in urine: Secondary | ICD-10-CM | POA: Insufficient documentation

## 2021-08-21 LAB — CBC WITH DIFFERENTIAL (CANCER CENTER ONLY)
Abs Immature Granulocytes: 0.01 10*3/uL (ref 0.00–0.07)
Basophils Absolute: 0.1 10*3/uL (ref 0.0–0.1)
Basophils Relative: 1 %
Eosinophils Absolute: 0.2 10*3/uL (ref 0.0–0.5)
Eosinophils Relative: 2 %
HCT: 33 % — ABNORMAL LOW (ref 36.0–46.0)
Hemoglobin: 9.4 g/dL — ABNORMAL LOW (ref 12.0–15.0)
Immature Granulocytes: 0 %
Lymphocytes Relative: 15 %
Lymphs Abs: 1.1 10*3/uL (ref 0.7–4.0)
MCH: 22.6 pg — ABNORMAL LOW (ref 26.0–34.0)
MCHC: 28.5 g/dL — ABNORMAL LOW (ref 30.0–36.0)
MCV: 79.3 fL — ABNORMAL LOW (ref 80.0–100.0)
Monocytes Absolute: 0.9 10*3/uL (ref 0.1–1.0)
Monocytes Relative: 12 %
Neutro Abs: 5.2 10*3/uL (ref 1.7–7.7)
Neutrophils Relative %: 70 %
Platelet Count: 459 10*3/uL — ABNORMAL HIGH (ref 150–400)
RBC: 4.16 MIL/uL (ref 3.87–5.11)
RDW: 18.2 % — ABNORMAL HIGH (ref 11.5–15.5)
WBC Count: 7.4 10*3/uL (ref 4.0–10.5)
nRBC: 0 % (ref 0.0–0.2)

## 2021-08-21 LAB — SAVE SMEAR(SSMR), FOR PROVIDER SLIDE REVIEW

## 2021-08-24 ENCOUNTER — Inpatient Hospital Stay: Payer: Medicare Other | Admitting: Nurse Practitioner

## 2021-08-24 ENCOUNTER — Inpatient Hospital Stay: Payer: Medicare Other

## 2021-08-24 ENCOUNTER — Encounter: Payer: Self-pay | Admitting: Nurse Practitioner

## 2021-08-24 VITALS — BP 160/62 | HR 96 | Temp 98.1°F | Resp 18 | Ht 60.0 in | Wt 157.8 lb

## 2021-08-24 DIAGNOSIS — D509 Iron deficiency anemia, unspecified: Secondary | ICD-10-CM

## 2021-08-24 LAB — URINALYSIS, COMPLETE (UACMP) WITH MICROSCOPIC
Bilirubin Urine: NEGATIVE
Glucose, UA: NEGATIVE mg/dL
Hgb urine dipstick: NEGATIVE
Ketones, ur: NEGATIVE mg/dL
Nitrite: NEGATIVE
Protein, ur: NEGATIVE mg/dL
Specific Gravity, Urine: 1.016 (ref 1.005–1.030)
pH: 7 (ref 5.0–8.0)

## 2021-08-24 NOTE — Progress Notes (Signed)
New Hematology/Oncology Consult ? ? ?Requesting MD: Dr. Dustin Flock ? ?[phone] ? ?   ? ?Reason for Consult: Iron deficiency anemia ? ?HPI: Krystal [name] is a 71 year old woman referred for evaluation of iron deficiency anemia.  Labs from 06/01/2021 returned with a hemoglobin of 9.6, MCV 72, RDW 20, normal total white count, platelets elevated at 528, ferritin 3.5.  Upper endoscopy 07/14/2021 with normal esophagus, normal stomach, hiatal hernia without erosions, normal examined duodenum, biopsied duodenal bulb with benign duodenal mucosa with no diagnostic abnormality; colonoscopy 07/14/2021 with diverticulosis in the sigmoid colon, normal terminal ileum, small internal hemorrhoids; ferritin 07/14/2021 returned at 5.8.  Capsule endoscopy 08/03/2021 with edematous mucosa and inflammatory changes starting at 3-hour 6 minutes, continuing in a patchy fashion with multiple ulcerations, edema through the 4-hour mark where severe inflammatory changes and ulceration noted.  CT enterography planned. ? ?Dr. Blanch Media office note dated 05/26/2021 indicates Krystal [name] was found to be anemic in April 2022 with a hemoglobin of 9.3, MCV 75, normal chemistry panel, stool Hemoccult testing negative, ferritin 8, saturation 8%, B12 deficiency with a level of 205. ? ?She reports taking 2 different iron preparations twice a day for many months.  Aside from noticing black stools she tolerated the iron well without constipation or nausea/vomiting.  She is not aware of any bleeding.  Specifically no bloody or black stools, no vaginal bleeding and no hematuria.  She eats a regular diet.  She does not crave ice or other nonnutritive substances.  She has had no change in bowel habits.  Overall good energy level.  May be mild dyspnea on exertion.  No cough.  No chest pain. ? 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  She completed AC chemotherapy and left breast radiation.  She then completed 5 years of adjuvant  Arimidex. ? ?Past Medical History:  ?Diagnosis Date  ? Anemia   ? Arthritis   ? right thumb - no meds  ? Breast cancer ([place]) 2005  ? left breast  ? Cataract   ? Diabetes mellitus without complication ([place])   ? type 2  ? 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 Hyperlipidemia   ? Hypertension   ? Personal history of chemotherapy   ? Personal history of radiation therapy   ?: ? ? ?Past Surgical History:  ?Procedure Laterality Date  ? BREAST LUMPECTOMY Left 2005  ? BREAST SURGERY  2005  ? Left Br Lumpectomy  ? CESAREAN SECTION  2 times  ? chemo and radiation  2005-2006  ? for breast cancer/ 4 chemo treatments and 36 radiation treatments  ? COLONOSCOPY    ? x 2  ? CYSTOSCOPY N/A 11/25/2015  ? Procedure: CYSTOSCOPY;  Surgeon: Sanjuana Kava, MD;  Location: [facility];  Service: Gynecology;  Laterality: N/A;  ? LAPAROSCOPIC VAGINAL HYSTERECTOMY WITH SALPINGO OOPHORECTOMY Bilateral 11/25/2015  ? Procedure: LAPAROSCOPIC ASSISTED VAGINAL HYSTERECTOMY WITH BILATERAL SALPINGO OOPHORECTOMY, LAPARSCOPIC CYSTOTOMY REPAIR WITH BILATERAL RETROGRADES;  Surgeon: Sanjuana Kava, MD;  Location: [facility];  Service: Gynecology;  Laterality: Bilateral;  ? NASAL SINUS SURGERY    ? TONSILLECTOMY    ? TUBAL LIGATION    ? WISDOM TOOTH EXTRACTION    ?: ? ? ?Current Outpatient Medications:  ?  calcium carbonate (TUMS EX) 750 MG chewable tablet, Chew 1 tablet by mouth as needed for heartburn., Disp: , Rfl:  ?  ferrous sulfate 325 (65 FE) MG EC tablet, Take 1 tablet (325 mg  total) by mouth 2 (two) times daily before a meal., Disp: 60 tablet, Rfl: 3 ?  Insulin Human (INSULIN PUMP) SOLN, Inject 1 each into the skin continuous. V-Go Disposable Insulin Delivery Device administers 78 units per 24 hours of insulin lispro (Humalog)., Disp: , Rfl:  ?  Lancets (ONETOUCH ULTRASOFT) lancets, , Disp: , Rfl:  ?  metFORMIN (GLUCOPHAGE) 500 MG tablet, Take 500 mg by mouth 2 (two) times daily. , Disp: , Rfl:  ?  ONE TOUCH ULTRA TEST test strip, , Disp: , Rfl:  ?   pravastatin (PRAVACHOL) 40 MG tablet, Take 40 mg by mouth Nightly., Disp: , Rfl:  ?  valsartan-hydrochlorothiazide (DIOVAN-HCT) 160-25 MG per tablet, Take 1 tablet by mouth Daily., Disp: , Rfl:  ?  iron polysaccharides (NIFEREX) 150 MG capsule, Take 1 capsule by mouth daily. (Patient not taking: Reported on 07/14/2021), Disp: , Rfl:  ?  oxyCODONE-acetaminophen (ROXICET) 5-325 MG tablet, Take 1 tablet by mouth every 4 (four) hours as needed for severe pain. (Patient not taking: Reported on 07/14/2021), Disp: 30 tablet, Rfl: 0 ?  ULTICARE MINI PEN NEEDLES 31G X 6 MM MISC, , Disp: , Rfl:  ?No current facility-administered medications for this visit. ? ?Facility-Administered Medications Ordered in Other Visits:  ?  iopamidol (ISOVUE-300) 61 % injection 90 mL, 90 mL, Other, Once PRN, Sanjuana Kava, MD ?  iopamidol (ISOVUE-300) 61 % injection 90 mL, 90 mL, Intravenous, Once PRN, Sanjuana Kava, MD: ? ? ?No Known Allergies: ? ?FH: No family history of a blood disorder.  Paternal aunt had breast cancer.  No family history of colon cancer. ? ?SOCIAL HISTORY: She lives in [place].  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  No tobacco or alcohol use. ? ?Review of Systems: She reports taking 2 different iron preparations twice a day for many months.  Aside from noticing black stools she tolerated the iron well without constipation or nausea/vomiting.  She is not aware of any bleeding.  Specifically no bloody or black stools, no vaginal bleeding and no hematuria.  She eats a regular diet.  She does not crave ice or other nonnutritive substances.  She has had no change in bowel habits.  Overall good energy level.  Maybe mild dyspnea on exertion, does not interfere with activity.  No cough.  No chest pain.  No dysphagia.  No fevers or sweats. ? ?Physical Exam: ? ?Blood pressure (!) 160/62, pulse 96, temperature 98.1 ?F (36.7 ?C), temperature source Oral, resp. rate 18, height 5' (1.524 m), weight 157 lb 12.8 oz  (71.6 kg), SpO2 100 %. ? ?HEENT: Mild conjunctival pallor. ?Lungs: Lungs clear bilaterally. ?Cardiac: Regular rate and rhythm. ?Abdomen: No hepatosplenomegaly.  No mass. ?Vascular: No leg edema. ?Lymph nodes: No palpable cervical, supraclavicular, axillary or inguinal lymph nodes. ?Breast: Status post left lumpectomy.  No evidence for local tumor recurrence.  Firm tissue at the lumpectomy site. No mass palpated in either breast. ?Skin: No rash. ? ? ?LABS: ? ?Peripheral blood smear- ? ?RADIOLOGY: ? ?No results found. ? ?Assessment and Plan:  ? ?Iron deficiency anemia ?07/14/2021 upper endoscopy-normal esophagus, normal stomach, hiatal hernia without erosions, normal examined duodenum, biopsied duodenal bulb with benign duodenal mucosa with no diagnostic abnormality  ?07/14/2021 colonoscopy-diverticulosis in the sigmoid colon, normal terminal ileum, small internal hemorrhoids;  ?07/14/2021 ferritin 5.8 ?08/03/2021 capsule endoscopy-edematous mucosa and inflammatory changes starting at 3-hour 6 minutes, continuing in a patchy fashion with multiple ulcerations, edema through the 4-hour mark where severe  inflammatory changes and ulceration noted.  CT enterography planned. 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  She completed AC chemotherapy and left breast radiation.  She then completed 5 years of adjuvant Arimidex. ?B12 deficiency ?Diabetes ? ?Ms. Camacho has a microcytic anemia.  Ferritin is low.  The red blood cell indices and peripheral blood smear are consistent with a diagnosis of iron deficiency.  Recent capsule endoscopy showed severe inflammatory changes and ulceration.  CT enterography is planned. ? ?She has not responded to a trial of oral iron.  We discussed IV iron.  We reviewed the potential for an allergic reaction and anaphylaxis.  She agrees to proceed.  She will return for Feraheme 510 mg IV weekly x2 beginning next week. ? ?She will submit urine for a urinalysis today. ? ?We will see her in  follow-up with repeat labs in 6 weeks. ? ?Patient seen with Dr. Benay Spice. ? ? ? ? ?Ned Card, NP ?08/24/2021, 3:03 PM  ? ?This was a shared visit with Ned Card.  Ms. Tornow was interviewed and examined.

## 2021-08-25 ENCOUNTER — Other Ambulatory Visit: Payer: Self-pay | Admitting: Nurse Practitioner

## 2021-08-25 ENCOUNTER — Encounter (HOSPITAL_COMMUNITY): Payer: Self-pay

## 2021-08-25 ENCOUNTER — Ambulatory Visit (HOSPITAL_COMMUNITY)
Admission: RE | Admit: 2021-08-25 | Discharge: 2021-08-25 | Disposition: A | Discharge status: Home / Self Care | Payer: Medicare Other | Source: Ambulatory Visit | Attending: Internal Medicine | Admitting: Internal Medicine

## 2021-08-25 DIAGNOSIS — R590 Localized enlarged lymph nodes: Secondary | ICD-10-CM | POA: Insufficient documentation

## 2021-08-25 DIAGNOSIS — I700 Atherosclerosis of aorta: Secondary | ICD-10-CM | POA: Insufficient documentation

## 2021-08-25 DIAGNOSIS — D509 Iron deficiency anemia, unspecified: Secondary | ICD-10-CM | POA: Diagnosis present

## 2021-08-25 LAB — POCT I-STAT CREATININE: Creatinine, Ser: 0.8 mg/dL (ref 0.44–1.00)

## 2021-08-25 MED ORDER — IOHEXOL 300 MG/ML  SOLN
100.0000 mL | Freq: Once | INTRAMUSCULAR | Status: AC | PRN
  Administered 2021-08-25: 100 mL via INTRAVENOUS

## 2021-08-25 MED ORDER — BARIUM SULFATE 0.1 % PO SUSP
ORAL | Status: AC
  Filled 2021-08-25: qty 3

## 2021-08-25 MED ORDER — SODIUM CHLORIDE (PF) 0.9 % IJ SOLN
INTRAMUSCULAR | Status: AC
  Filled 2021-08-25: qty 50

## 2021-08-26 ENCOUNTER — Encounter: Payer: Self-pay | Admitting: Internal Medicine

## 2021-08-26 ENCOUNTER — Telehealth: Payer: Self-pay

## 2021-08-26 ENCOUNTER — Other Ambulatory Visit: Payer: Self-pay

## 2021-08-26 DIAGNOSIS — D509 Iron deficiency anemia, unspecified: Secondary | ICD-10-CM

## 2021-08-26 NOTE — Telephone Encounter (Signed)
Called the patient, no answer. I left a vm to call the office back to schedule a lab appointment for a urinalysis and culture. ?

## 2021-08-26 NOTE — Telephone Encounter (Signed)
Patient is schedule for a repeat UA and urin culture on 08/27/21 ?

## 2021-08-26 NOTE — Telephone Encounter (Signed)
-----   Message from Owens Shark, NP sent at 08/25/2021  5:33 PM EDT ----- ?Please let her know the urinalysis was negative for blood but did show increased white blood cells which could indicate an infection.  We were unable to obtain a urine culture.  Please see if she can come back into the office this week for a urinalysis and culture.  Also please ask her about urinary symptoms. ? ?

## 2021-08-27 ENCOUNTER — Inpatient Hospital Stay: Payer: Medicare Other

## 2021-08-27 ENCOUNTER — Encounter: Payer: Self-pay | Admitting: *Deleted

## 2021-08-27 DIAGNOSIS — D509 Iron deficiency anemia, unspecified: Secondary | ICD-10-CM

## 2021-08-27 LAB — URINALYSIS, COMPLETE (UACMP) WITH MICROSCOPIC
Bilirubin Urine: NEGATIVE
Glucose, UA: NEGATIVE mg/dL
Hgb urine dipstick: NEGATIVE
Ketones, ur: NEGATIVE mg/dL
Nitrite: NEGATIVE
Protein, ur: NEGATIVE mg/dL
Specific Gravity, Urine: 1.013 (ref 1.005–1.030)
pH: 5.5 (ref 5.0–8.0)

## 2021-08-28 LAB — URINE CULTURE: Culture: <10000 — AB

## 2021-09-02 ENCOUNTER — Other Ambulatory Visit: Payer: Self-pay | Admitting: Nurse Practitioner

## 2021-09-02 ENCOUNTER — Inpatient Hospital Stay: Payer: Medicare Other

## 2021-09-02 VITALS — BP 121/56 | HR 70 | Temp 98.8°F | Resp 18 | Ht 60.0 in | Wt 157.1 lb

## 2021-09-02 DIAGNOSIS — D509 Iron deficiency anemia, unspecified: Secondary | ICD-10-CM | POA: Diagnosis not present

## 2021-09-02 MED ORDER — SODIUM CHLORIDE 0.9 % IV SOLN
510.0000 mg | Freq: Once | INTRAVENOUS | Status: AC
  Administered 2021-09-02: 510 mg via INTRAVENOUS
  Filled 2021-09-02: qty 17

## 2021-09-02 MED ORDER — SODIUM CHLORIDE 0.9 % IV SOLN: Freq: Once | INTRAVENOUS | Status: AC

## 2021-09-02 NOTE — Patient Instructions (Signed)

## 2021-09-04 ENCOUNTER — Encounter: Payer: Self-pay | Admitting: Internal Medicine

## 2021-09-09 ENCOUNTER — Inpatient Hospital Stay: Payer: Medicare Other | Attending: Oncology

## 2021-09-09 ENCOUNTER — Inpatient Hospital Stay: Payer: Medicare Other

## 2021-09-09 VITALS — BP 116/60 | HR 64 | Temp 98.7°F | Resp 18 | Ht 60.0 in | Wt 154.4 lb

## 2021-09-09 DIAGNOSIS — Z853 Personal history of malignant neoplasm of breast: Secondary | ICD-10-CM | POA: Diagnosis not present

## 2021-09-09 DIAGNOSIS — Z923 Personal history of irradiation: Secondary | ICD-10-CM | POA: Insufficient documentation

## 2021-09-09 DIAGNOSIS — E538 Deficiency of other specified B group vitamins: Secondary | ICD-10-CM | POA: Insufficient documentation

## 2021-09-09 DIAGNOSIS — Z79899 Other long term (current) drug therapy: Secondary | ICD-10-CM | POA: Diagnosis not present

## 2021-09-09 DIAGNOSIS — D509 Iron deficiency anemia, unspecified: Secondary | ICD-10-CM | POA: Insufficient documentation

## 2021-09-09 DIAGNOSIS — E119 Type 2 diabetes mellitus without complications: Secondary | ICD-10-CM | POA: Insufficient documentation

## 2021-09-09 MED ORDER — SODIUM CHLORIDE 0.9 % IV SOLN
510.0000 mg | Freq: Once | INTRAVENOUS | Status: AC
  Administered 2021-09-09: 510 mg via INTRAVENOUS
  Filled 2021-09-09: qty 17

## 2021-09-09 MED ORDER — SODIUM CHLORIDE 0.9 % IV SOLN: Freq: Once | INTRAVENOUS | Status: AC

## 2021-09-09 NOTE — Patient Instructions (Signed)

## 2021-09-16 ENCOUNTER — Encounter: Payer: Self-pay | Admitting: Internal Medicine

## 2021-09-16 ENCOUNTER — Ambulatory Visit: Payer: Medicare Other | Admitting: Internal Medicine

## 2021-09-16 VITALS — BP 142/66 | HR 70 | Ht 60.0 in | Wt 156.0 lb

## 2021-09-16 DIAGNOSIS — R9389 Abnormal findings on diagnostic imaging of other specified body structures: Secondary | ICD-10-CM

## 2021-09-16 DIAGNOSIS — D509 Iron deficiency anemia, unspecified: Secondary | ICD-10-CM | POA: Diagnosis not present

## 2021-09-16 NOTE — Patient Instructions (Addendum)
If you are age 73 or older, your body mass index should be between 23-30. Your Body mass index is 30.47 kg/m?Marland Kitchen If this is out of the aforementioned range listed, please consider follow up with your Primary Care Provider. ? ?If you are age 56 or younger, your body mass index should be between 19-25. Your Body mass index is 30.47 kg/m?Marland Kitchen If this is out of the aformentioned range listed, please consider follow up with your Primary Care Provider.  ? ?________________________________________________________ ? ?The [facility] providers would like to encourage you to use [facility] to communicate with providers for non-urgent requests or questions.  Due to long hold times on the telephone, sending your provider a message by [facility] may be a faster and more efficient way to get a response.  Please allow 48 business hours for a response.  Please remember that this is for non-urgent requests.  ?_______________________________________________________ ? ?You will be contacted by [facility] in the next 2 days to arrange a CT Enterography at [facility].  The number on your caller ID will be [phone], please answer when they call.  If you have not heard from them in 2 days please call [phone] to schedule.    ? ?After this has been scheduled we will schedule an appointment with Dr. Henrene Pastor to review results. ? ?Thank you for entrusting me with your care and for choosing [facility], ?Dr. Scarlette Shorts ? ?

## 2021-09-17 ENCOUNTER — Telehealth: Payer: Self-pay | Admitting: Internal Medicine

## 2021-09-17 ENCOUNTER — Encounter: Payer: Self-pay | Admitting: Internal Medicine

## 2021-09-17 NOTE — Progress Notes (Signed)
HISTORY OF PRESENT ILLNESS: ? ?Krystal [name] is a 71 y.o. female with past medical history as listed below who was evaluated in the office May 26, 2021 regarding iron deficiency anemia.  She does have a history of GERD, B12 deficiency on replacement, and adenomatous colon polyps.  See that dictation for details.  She subsequently underwent colonoscopy and upper endoscopy on July 14, 2021.  On colonoscopy, the terminal ileum was normal.  There was sigmoid diverticulosis.  Small internal hemorrhoids.  No other abnormalities.  Upper endoscopy revealed a small sliding hiatal hernia without erosions.  Exam was otherwise unremarkable.  Duodenal biopsies were normal.  As such, she was set up for capsule endoscopy was performed August 03, 2021.  The examination revealed edematous and mucosal inflammatory changes in the mid small bowel.  The mucosa proximal and distal to this area appeared normal.  The exam was complete.  Additional blood work found an elevated sedimentation rate of 101.  CRP was 2.2.  She was placed on oral iron therapy and told to refrain from NSAIDs.  She was scheduled and saw hematology regarding her anemia on August 24, 2021.  A CT enterography was performed to further evaluate the abnormal area identified on capsule endoscopy.  She was found to have moderate mucosal thickening and enhancement of the jejunal loops suggesting inflammatory or infectious enteritis.  The mid distal ileum and terminal ileum were normal.  There were numerous enlarged lymph nodes throughout the small bowel mesentery, felt most likely reactive.  No evidence for obstruction or other abnormality.  She was told to continue iron, refrain from NSAIDs, and follow-up at this time.  Patient is accompanied by her husband. ? ?I reviewed with her the entire work-up as outlined above.  She tells me that she did use NSAIDs sporadically but nothing recent after being directed to avoid such.  She denies any abdominal pain.  She has had no  change in weight.  She denies change in bowel habits.  Overall, she states that she feels quite well. ? ?REVIEW OF SYSTEMS: ? ?All non-GI ROS negative unless otherwise stated in the HPI except for sinus and allergy trouble, arthritis, sleeping problems ? ?Past Medical History:  ?Diagnosis Date  ? Anemia   ? Arthritis   ? right thumb - no meds  ? Breast cancer ([place]) 2005  ? left breast  ? Cataract   ? Diabetes mellitus without complication ([place])   ? type 2  ? 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 Hyperlipidemia   ? Hypertension   ? Personal history of chemotherapy   ? Personal history of radiation therapy   ? ? ?Past Surgical History:  ?Procedure Laterality Date  ? BREAST LUMPECTOMY Left 2005  ? BREAST SURGERY  2005  ? Left Br Lumpectomy  ? CESAREAN SECTION  2 times  ? chemo and radiation  2005-2006  ? for breast cancer/ 4 chemo treatments and 36 radiation treatments  ? COLONOSCOPY    ? x 2  ? CYSTOSCOPY N/A 11/25/2015  ? Procedure: CYSTOSCOPY;  Surgeon: Sanjuana Kava, MD;  Location: [facility];  Service: Gynecology;  Laterality: N/A;  ? LAPAROSCOPIC VAGINAL HYSTERECTOMY WITH SALPINGO OOPHORECTOMY Bilateral 11/25/2015  ? Procedure: LAPAROSCOPIC ASSISTED VAGINAL HYSTERECTOMY WITH BILATERAL SALPINGO OOPHORECTOMY, LAPARSCOPIC CYSTOTOMY REPAIR WITH BILATERAL RETROGRADES;  Surgeon: Sanjuana Kava, MD;  Location: [facility];  Service: Gynecology;  Laterality: Bilateral;  ? NASAL SINUS SURGERY    ? TONSILLECTOMY    ? TUBAL  LIGATION    ? WISDOM TOOTH EXTRACTION    ? ? ?Social History ?[name]  reports that she has never smoked. She has never used smokeless tobacco. She reports that she does not drink alcohol and does not use drugs. ? ?family history includes Breast cancer in her maternal aunt; Heart disease in her father and mother. ? ?No Known Allergies ? ?  ? ?PHYSICAL EXAMINATION: ?Vital signs: BP (!) 142/66   Pulse 70   Ht 5' (1.524 m)   Wt 156 lb (70.8 kg)   BMI 30.47 kg/m?   ?Constitutional: generally  well-appearing, no acute distress ?Psychiatric: alert and oriented x3, cooperative ?Eyes: extraocular movements intact, anicteric, conjunctiva pink ?Mouth: oral pharynx moist, no lesions ?Neck: supple no lymphadenopathy ?Cardiovascular: heart regular rate and rhythm, no murmur ?Lungs: clear to auscultation bilaterally ?Abdomen: soft, nontender, nondistended, no obvious ascites, no peritoneal signs, normal bowel sounds, no organomegaly ?Rectal: Omitted ?Extremities: no clubbing, cyanosis, or lower extremity edema bilaterally ?Skin: no lesions on visible extremities ?Neuro: No focal deficits.  Cranial nerves intact ? ?ASSESSMENT: ? ?1.  Focal ulcerative area in the jejunum as described with a combination of capsule endoscopy and CT enterography.  This is likely the etiology of her iron deficiency anemia.  She is completely asymptomatic.  Differential diagnosis remains NSAID induced enteropathy, atypical IBD, neoplasia, or other unusual entity. ? ? ?PLAN: ? ?1.  We discussed the findings and differential diagnosis ?2.  We discussed possible empiric therapies, versus small bowel balloon endoscopy, versus observation with close follow-up.  We have all opted for the latter. ?3.  Avoid all NSAIDs ?4.  Repeat CT enterography in 3 months with office follow-up at that time ?5.  Contact this office in the interim for any questions or problems or change in clinical status. ?6.  Continue ongoing treatment and monitoring of anemia with hematology. ?A total time of 35 minutes was spent preparing to see the patient, reviewing myriad of data, obtaining interval history, performing medically appropriate physical examination, counseling the patient and her husband regarding the above listed issues, and documenting clinical information in the health record ? ? ? ?  ?

## 2021-09-17 NOTE — Telephone Encounter (Signed)
We received a call from patient wanting to update nurse. Per patient, has scheduled a CT at [facility] 12/08/21. ?

## 2021-09-17 NOTE — Telephone Encounter (Signed)
Spoke with pt and pt stated she wanted to let us know when CT was scheduled. Pt denied any further concerns.  ?

## 2021-10-06 ENCOUNTER — Inpatient Hospital Stay: Payer: Medicare Other

## 2021-10-06 ENCOUNTER — Inpatient Hospital Stay: Payer: Medicare Other | Admitting: Oncology

## 2021-10-06 VITALS — BP 157/70 | HR 62 | Temp 98.1°F | Resp 18 | Ht 60.0 in | Wt 155.2 lb

## 2021-10-06 DIAGNOSIS — D509 Iron deficiency anemia, unspecified: Secondary | ICD-10-CM

## 2021-10-06 LAB — CBC WITH DIFFERENTIAL (CANCER CENTER ONLY)
Abs Immature Granulocytes: 0.02 10*3/uL (ref 0.00–0.07)
Basophils Absolute: 0 10*3/uL (ref 0.0–0.1)
Basophils Relative: 1 %
Eosinophils Absolute: 0.2 10*3/uL (ref 0.0–0.5)
Eosinophils Relative: 3 %
HCT: 37.5 % (ref 36.0–46.0)
Hemoglobin: 11.5 g/dL — ABNORMAL LOW (ref 12.0–15.0)
Immature Granulocytes: 0 %
Lymphocytes Relative: 23 %
Lymphs Abs: 1.5 10*3/uL (ref 0.7–4.0)
MCH: 26.7 pg (ref 26.0–34.0)
MCHC: 30.7 g/dL (ref 30.0–36.0)
MCV: 87 fL (ref 80.0–100.0)
Monocytes Absolute: 0.7 10*3/uL (ref 0.1–1.0)
Monocytes Relative: 11 %
Neutro Abs: 4 10*3/uL (ref 1.7–7.7)
Neutrophils Relative %: 62 %
Platelet Count: 387 10*3/uL (ref 150–400)
RBC: 4.31 MIL/uL (ref 3.87–5.11)
RDW: 23.9 % — ABNORMAL HIGH (ref 11.5–15.5)
WBC Count: 6.4 10*3/uL (ref 4.0–10.5)
nRBC: 0 % (ref 0.0–0.2)

## 2021-10-06 LAB — FERRITIN: Ferritin: 117 ng/mL (ref 11–307)

## 2021-10-06 NOTE — Progress Notes (Signed)
  [place] OFFICE PROGRESS NOTE   Diagnosis: Iron deficiency anemia  INTERVAL HISTORY:   Ms. Gamel returns as scheduled.  She completed treatment with IV iron on 09/02/2021 and 09/09/2021.  She tolerated the treatment well.  She continues oral iron.  The stool is dark.  No bleeding.  No complaint. A CT enterography 08/25/2021 revealed mucosal thickening in the enhancement of jejunal loops within large nodes throughout the small bowel mesentery felt to be reactive. She is scheduled for follow-up CT in August.  Objective:  Vital signs in last 24 hours:  Blood pressure (!) 157/70, pulse 62, temperature 98.1 F (36.7 C), temperature source Oral, resp. rate 18, height 5' (1.524 m), weight 155 lb 3.2 oz (70.4 kg), SpO2 99 %.    Lymphatics: No cervical, supraclavicular, axillary, or inguinal nodes Resp: Lungs clear bilaterally Cardio: Regular rate and rhythm GI: No hepatosplenomegaly, no mass, nontender Vascular: No leg edema   Lab Results:  Lab Results  Component Value Date   WBC 6.4 10/06/2021   HGB 11.5 (L) 10/06/2021   HCT 37.5 10/06/2021   MCV 87.0 10/06/2021   PLT 387 10/06/2021   NEUTROABS 4.0 10/06/2021    CMP  Lab Results  Component Value Date   NA 135 11/12/2015   K 4.1 11/12/2015   CL 101 11/12/2015   CO2 26 11/12/2015   GLUCOSE 125 (H) 11/12/2015   BUN 18 11/12/2015   CREATININE 0.80 08/25/2021   CALCIUM 8.8 (L) 11/12/2015   PROT 7.8 11/12/2015   ALBUMIN 3.7 11/12/2015   AST 19 11/12/2015   ALT 15 11/12/2015   ALKPHOS 85 11/12/2015   BILITOT 0.5 11/12/2015   GFRNONAA >60 11/25/2015   GFRAA >60 11/25/2015    Medications: I have reviewed the patient's current medications.   Assessment/Plan: Iron deficiency anemia 07/14/2021 upper endoscopy-normal esophagus, normal stomach, hiatal hernia without erosions, normal examined duodenum, biopsied duodenal bulb with benign duodenal mucosa with no diagnostic abnormality  07/14/2021  colonoscopy-diverticulosis in the sigmoid colon, normal terminal ileum, small internal hemorrhoids;  07/14/2021 ferritin 5.8 08/03/2021 capsule endoscopy-edematous mucosa and inflammatory changes starting at 3-hour 6 minutes, continuing in a patchy fashion with multiple ulcerations, edema through the 4-hour mark where severe inflammatory changes and ulceration noted.  CT enterography planned. 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  She completed AC chemotherapy and left breast radiation.  She then completed 5 years of adjuvant Arimidex. B12 deficiency Diabetes   Disposition: Ms. Francesconi has iron deficiency anemia.  The anemia has corrected significantly after receiving IV iron.  We will follow-up on the ferritin level from today.  She will continue oral iron.  She will return for an office and lab visit in 3 months. She was found to have small bowel ulcerations on capsule endoscopy 08/03/2021 with areas of thickening/enhancement of jejunal loops and mesenteric adenopathy on a CT enterography 08/25/2021.  The lymph nodes were felt to most likely be reactive.  She is scheduled for follow-up CT in August. She will discuss the indication for antiacid therapy with Dr. Henrene Pastor.   Betsy Coder, MD  10/06/2021  10:37 AM

## 2021-11-12 ENCOUNTER — Other Ambulatory Visit: Payer: Self-pay

## 2021-11-12 ENCOUNTER — Encounter: Payer: Self-pay | Admitting: Internal Medicine

## 2021-11-12 MED ORDER — FERROUS SULFATE 325 (65 FE) MG PO TBEC: 325.0000 mg | DELAYED_RELEASE_TABLET | Freq: Two times a day (BID) | ORAL | 3 refills | Status: DC

## 2021-12-08 ENCOUNTER — Ambulatory Visit (HOSPITAL_BASED_OUTPATIENT_CLINIC_OR_DEPARTMENT_OTHER)
Admission: RE | Admit: 2021-12-08 | Discharge: 2021-12-08 | Disposition: A | Discharge status: Home / Self Care | Payer: Medicare Other | Source: Ambulatory Visit | Attending: Internal Medicine | Admitting: Internal Medicine

## 2021-12-08 ENCOUNTER — Encounter (HOSPITAL_BASED_OUTPATIENT_CLINIC_OR_DEPARTMENT_OTHER): Payer: Self-pay

## 2021-12-08 DIAGNOSIS — D509 Iron deficiency anemia, unspecified: Secondary | ICD-10-CM | POA: Insufficient documentation

## 2021-12-08 DIAGNOSIS — R9389 Abnormal findings on diagnostic imaging of other specified body structures: Secondary | ICD-10-CM | POA: Insufficient documentation

## 2021-12-08 LAB — POCT I-STAT CREATININE: Creatinine, Ser: 0.8 mg/dL (ref 0.44–1.00)

## 2021-12-08 MED ORDER — IOHEXOL 300 MG/ML  SOLN
100.0000 mL | Freq: Once | INTRAMUSCULAR | Status: AC | PRN
  Administered 2021-12-08: 100 mL via INTRAVENOUS

## 2021-12-09 ENCOUNTER — Other Ambulatory Visit: Payer: Self-pay

## 2021-12-09 DIAGNOSIS — D509 Iron deficiency anemia, unspecified: Secondary | ICD-10-CM

## 2021-12-17 ENCOUNTER — Ambulatory Visit: Payer: Medicare Other | Admitting: Internal Medicine

## 2022-01-06 ENCOUNTER — Encounter: Payer: Self-pay | Admitting: Oncology

## 2022-01-06 ENCOUNTER — Inpatient Hospital Stay: Payer: Medicare Other | Admitting: Oncology

## 2022-01-06 ENCOUNTER — Inpatient Hospital Stay: Payer: Medicare Other | Attending: Oncology

## 2022-01-06 ENCOUNTER — Telehealth: Payer: Self-pay

## 2022-01-06 VITALS — BP 158/78 | HR 80 | Temp 98.2°F | Resp 18 | Ht 60.0 in | Wt 155.8 lb

## 2022-01-06 DIAGNOSIS — E119 Type 2 diabetes mellitus without complications: Secondary | ICD-10-CM | POA: Diagnosis not present

## 2022-01-06 DIAGNOSIS — Z923 Personal history of irradiation: Secondary | ICD-10-CM | POA: Insufficient documentation

## 2022-01-06 DIAGNOSIS — Z853 Personal history of malignant neoplasm of breast: Secondary | ICD-10-CM | POA: Insufficient documentation

## 2022-01-06 DIAGNOSIS — D509 Iron deficiency anemia, unspecified: Secondary | ICD-10-CM | POA: Insufficient documentation

## 2022-01-06 DIAGNOSIS — E538 Deficiency of other specified B group vitamins: Secondary | ICD-10-CM | POA: Insufficient documentation

## 2022-01-06 LAB — CBC WITH DIFFERENTIAL (CANCER CENTER ONLY)
Abs Immature Granulocytes: 0.04 10*3/uL (ref 0.00–0.07)
Basophils Absolute: 0 10*3/uL (ref 0.0–0.1)
Basophils Relative: 1 %
Eosinophils Absolute: 0.1 10*3/uL (ref 0.0–0.5)
Eosinophils Relative: 2 %
HCT: 38.6 % (ref 36.0–46.0)
Hemoglobin: 13 g/dL (ref 12.0–15.0)
Immature Granulocytes: 1 %
Lymphocytes Relative: 25 %
Lymphs Abs: 1.6 10*3/uL (ref 0.7–4.0)
MCH: 31.6 pg (ref 26.0–34.0)
MCHC: 33.7 g/dL (ref 30.0–36.0)
MCV: 93.7 fL (ref 80.0–100.0)
Monocytes Absolute: 0.7 10*3/uL (ref 0.1–1.0)
Monocytes Relative: 12 %
Neutro Abs: 3.8 10*3/uL (ref 1.7–7.7)
Neutrophils Relative %: 59 %
Platelet Count: 351 10*3/uL (ref 150–400)
RBC: 4.12 MIL/uL (ref 3.87–5.11)
RDW: 13.4 % (ref 11.5–15.5)
WBC Count: 6.3 10*3/uL (ref 4.0–10.5)
nRBC: 0 % (ref 0.0–0.2)

## 2022-01-06 LAB — FERRITIN: Ferritin: 66 ng/mL (ref 11–307)

## 2022-01-06 NOTE — Telephone Encounter (Signed)
-----   Message from Ladell Pier, MD sent at 01/06/2022  1:30 PM EDT ----- Please call patient, the iron level is normal, follow-up as scheduled

## 2022-01-06 NOTE — Progress Notes (Signed)
  [place] OFFICE PROGRESS NOTE   Diagnosis: Iron deficiency anemia  INTERVAL HISTORY:   Krystal [name] returns as scheduled.  She feels well.  She continues iron.  No bleeding.  No complaint. She underwent a repeat CT enterography on 12/08/2021.  She plans to follow-up with Dr. Henrene Pastor in 1-2 months.  Objective:  Vital signs in last 24 hours:  Blood pressure (!) 158/78, pulse 80, temperature 98.2 F (36.8 C), temperature source Oral, resp. rate 18, height 5' (1.524 m), weight 155 lb 12.8 oz (70.7 kg), SpO2 100 %.    HEENT: Neck without mass Lymphatics: No cervical, supraclavicular, axillary, or inguinal nodes Resp: Lungs clear bilaterally Cardio: Regular rate and rhythm GI: No hepatosplenomegaly, no mass, nontender Vascular: No leg edema   Lab Results:  Lab Results  Component Value Date   WBC 6.3 01/06/2022   HGB 13.0 01/06/2022   HCT 38.6 01/06/2022   MCV 93.7 01/06/2022   PLT 351 01/06/2022   NEUTROABS 3.8 01/06/2022    CMP  Lab Results  Component Value Date   NA 135 11/12/2015   K 4.1 11/12/2015   CL 101 11/12/2015   CO2 26 11/12/2015   GLUCOSE 125 (H) 11/12/2015   BUN 18 11/12/2015   CREATININE 0.80 12/08/2021   CALCIUM 8.8 (L) 11/12/2015   PROT 7.8 11/12/2015   ALBUMIN 3.7 11/12/2015   AST 19 11/12/2015   ALT 15 11/12/2015   ALKPHOS 85 11/12/2015   BILITOT 0.5 11/12/2015   GFRNONAA >60 11/25/2015   GFRAA >60 11/25/2015    Medications: I have reviewed the patient's current medications.   Assessment/Plan: Iron deficiency anemia 07/14/2021 upper endoscopy-normal esophagus, normal stomach, hiatal hernia without erosions, normal examined duodenum, biopsied duodenal bulb with benign duodenal mucosa with no diagnostic abnormality  07/14/2021 colonoscopy-diverticulosis in the sigmoid colon, normal terminal ileum, small internal hemorrhoids;  07/14/2021 ferritin 5.8 08/03/2021 capsule endoscopy-edematous mucosa and inflammatory changes starting at  3-hour 6 minutes, continuing in a patchy fashion with multiple ulcerations, edema through the 4-hour mark where severe inflammatory changes and ulceration noted.  CT enterography planned. 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  She completed AC chemotherapy and left breast radiation.  She then completed 5 years of adjuvant Arimidex. B12 deficiency Diabetes     Disposition: Krystal [name] appears well.  The iron deficiency anemia has corrected with oral/IV iron replacement.  We will follow-up on the ferritin level from today.  She will continue oral iron.  She will follow-up with Dr. Henrene Pastor for evaluation of the abdomen CT changes.  She continues yearly mammography with a remote history of left-sided breast cancer.  She will return for an office visit and CBC in 6 months.  I am available to see her sooner as needed.  Betsy Coder, MD  01/06/2022  10:01 AM

## 2022-01-06 NOTE — Telephone Encounter (Signed)
VM with message below per MD Benay Spice

## 2022-01-28 ENCOUNTER — Other Ambulatory Visit (INDEPENDENT_AMBULATORY_CARE_PROVIDER_SITE_OTHER): Payer: Medicare Other

## 2022-01-28 DIAGNOSIS — D509 Iron deficiency anemia, unspecified: Secondary | ICD-10-CM | POA: Diagnosis not present

## 2022-01-28 LAB — CBC WITH DIFFERENTIAL/PLATELET
Basophils Absolute: 0.1 10*3/uL (ref 0.0–0.1)
Basophils Relative: 0.9 % (ref 0.0–3.0)
Eosinophils Absolute: 0.2 10*3/uL (ref 0.0–0.7)
Eosinophils Relative: 2 % (ref 0.0–5.0)
HCT: 39.8 % (ref 36.0–46.0)
Hemoglobin: 13.1 g/dL (ref 12.0–15.0)
Lymphocytes Relative: 18.6 % (ref 12.0–46.0)
Lymphs Abs: 1.8 10*3/uL (ref 0.7–4.0)
MCHC: 32.9 g/dL (ref 30.0–36.0)
MCV: 94.5 fl (ref 78.0–100.0)
Monocytes Absolute: 0.7 10*3/uL (ref 0.1–1.0)
Monocytes Relative: 7.5 % (ref 3.0–12.0)
Neutro Abs: 6.9 10*3/uL (ref 1.4–7.7)
Neutrophils Relative %: 71 % (ref 43.0–77.0)
Platelets: 409 10*3/uL — ABNORMAL HIGH (ref 150.0–400.0)
RBC: 4.21 Mil/uL (ref 3.87–5.11)
RDW: 14.4 % (ref 11.5–15.5)
WBC: 9.8 10*3/uL (ref 4.0–10.5)

## 2022-01-28 LAB — SEDIMENTATION RATE: Sed Rate: 53 mm/hr — ABNORMAL HIGH (ref 0–30)

## 2022-01-28 LAB — C-REACTIVE PROTEIN: CRP: 1.5 mg/dL (ref 0.5–20.0)

## 2022-01-29 LAB — FERRITIN: Ferritin: 65.6 ng/mL (ref 10.0–291.0)

## 2022-02-01 ENCOUNTER — Encounter: Payer: Self-pay | Admitting: Internal Medicine

## 2022-02-01 ENCOUNTER — Ambulatory Visit (INDEPENDENT_AMBULATORY_CARE_PROVIDER_SITE_OTHER): Payer: Medicare Other | Admitting: Internal Medicine

## 2022-02-01 VITALS — BP 142/82 | HR 79 | Ht 61.0 in | Wt 153.0 lb

## 2022-02-01 DIAGNOSIS — D509 Iron deficiency anemia, unspecified: Secondary | ICD-10-CM | POA: Diagnosis not present

## 2022-02-01 DIAGNOSIS — E538 Deficiency of other specified B group vitamins: Secondary | ICD-10-CM | POA: Diagnosis not present

## 2022-02-01 DIAGNOSIS — K219 Gastro-esophageal reflux disease without esophagitis: Secondary | ICD-10-CM

## 2022-02-01 DIAGNOSIS — R9389 Abnormal findings on diagnostic imaging of other specified body structures: Secondary | ICD-10-CM

## 2022-02-01 DIAGNOSIS — Z86010 Personal history of colonic polyps: Secondary | ICD-10-CM

## 2022-02-01 NOTE — Patient Instructions (Signed)
_______________________________________________________  If you are age 73 or older, your body mass index should be between 23-30. Your Body mass index is 28.91 kg/m. If this is out of the aforementioned range listed, please consider follow up with your Primary Care Provider.  If you are age 46 or younger, your body mass index should be between 19-25. Your Body mass index is 28.91 kg/m. If this is out of the aformentioned range listed, please consider follow up with your Primary Care Provider.   ________________________________________________________  The [facility] providers would like to encourage you to use [facility] to communicate with providers for non-urgent requests or questions.  Due to long hold times on the telephone, sending your provider a message by [facility] may be a faster and more efficient way to get a response.  Please allow 48 business hours for a response.  Please remember that this is for non-urgent requests.  _______________________________________________________  I will call you to schedule your CT and lab work along with office visit

## 2022-02-01 NOTE — Progress Notes (Signed)
HISTORY OF PRESENT ILLNESS:  Krystal [name] is a 71 y.o. female with past medical history as listed below who presents today for follow-up regarding prior history of iron deficiency anemia and abnormal area of jejunum on capsule endoscopy and CT enterography.  She was last seen in this office Sep 16, 2021.  See that dictation for details.  With B12 and iron replacement her blood counts have normalized.  Throughout all of this, she has been completely asymptomatic without any worrisome clinical issues.  The abnormal area of jejunum was felt possibly to be NSAID induced enteropathy, atypical IBD, neoplasia, or unusual entity otherwise not described.  At the time of her last visit she was told to avoid all NSAIDs with plans to repeat CT enterography in about 3 months as well as follow-up blood work before this visit.  Patient tells me that she continues to remain completely asymptomatic.  She did describe 2 episodes of diarrhea that resolved without issue.  She continues on iron and B12 replacement.  CT enterography was repeated December 08, 2021.  She was found to have persistent mild stranding adjacent to the jejunum and in the small bowel mesentery.  Most recent blood work was unremarkable with normal hemoglobin 13.0, normal ferritin of 66.  Sedimentation rate had been 101, but most recently 53.  C-reactive protein was normal.    REVIEW OF SYSTEMS:  All non-GI ROS negative unless otherwise stated in the HPI except for insomnia, arthritis and right hand  Past Medical History:  Diagnosis Date   Anemia    Arthritis    right thumb - no meds   Breast cancer ([place]) 2005   left breast   Cataract    Diabetes mellitus without complication ([place])    type 2   Hearing loss    bilateral - no hearing aids   Hyperlipidemia    Hypertension    Personal history of chemotherapy    Personal history of radiation therapy     Past Surgical History:  Procedure Laterality Date   BREAST LUMPECTOMY Left 2005    BREAST SURGERY  2005   Left Br Lumpectomy   CESAREAN SECTION  2 times   chemo and radiation  2005-2006   for breast cancer/ 4 chemo treatments and 36 radiation treatments   COLONOSCOPY     x 2   CYSTOSCOPY N/A 11/25/2015   Procedure: CYSTOSCOPY;  Surgeon: Sanjuana Kava, MD;  Location: [facility];  Service: Gynecology;  Laterality: N/A;   LAPAROSCOPIC VAGINAL HYSTERECTOMY WITH SALPINGO OOPHORECTOMY Bilateral 11/25/2015   Procedure: LAPAROSCOPIC ASSISTED VAGINAL HYSTERECTOMY WITH BILATERAL SALPINGO OOPHORECTOMY, LAPARSCOPIC CYSTOTOMY REPAIR WITH BILATERAL RETROGRADES;  Surgeon: Sanjuana Kava, MD;  Location: [facility];  Service: Gynecology;  Laterality: Bilateral;   NASAL SINUS SURGERY     TONSILLECTOMY     TUBAL LIGATION     WISDOM TOOTH EXTRACTION      Social History [name]  reports that she has never smoked. She has never used smokeless tobacco. She reports that she does not drink alcohol and does not use drugs.  family history includes Breast cancer in her maternal aunt; Heart disease in her father and mother.  No Known Allergies     PHYSICAL EXAMINATION: Vital signs: BP (!) 142/82   Pulse 79   Ht '5\' 1"'  (1.549 m)   Wt 153 lb (69.4 kg)   BMI 28.91 kg/m .  Stable weight Constitutional: generally well-appearing, no acute distress Psychiatric: alert and oriented x3, cooperative Eyes: extraocular movements  intact, anicteric, conjunctiva pink Mouth: oral pharynx moist, no lesions Neck: supple no lymphadenopathy Cardiovascular: heart regular rate and rhythm, no murmur Lungs: clear to auscultation bilaterally Abdomen: soft, nontender, nondistended, no obvious ascites, no peritoneal signs, normal bowel sounds, no organomegaly Rectal: Omitted Extremities: no clubbing, cyanosis, or lower extremity edema bilaterally Skin: no lesions on visible extremities Neuro: No focal deficits.  Cranial nerves intact  ASSESSMENT:  1.  Abnormal area of jejunum as described.  Asymptomatic.  Under  close observation. 2.  History of iron deficiency anemia.  Extensive work-up as previously outlined.  On replacement 3.  B12 deficiency.  On replacement   PLAN:  1.  Continue iron 2.  Continue B12 3.  We discussed small bowel deep enteroscopy versus ongoing observation.  She prefers the latter, at present. 4.  Office follow-up in 6 months.  She knows to contact the office in the interim for any questions or problems 5.  Repeat CT enterography prior to office follow-up 6.  Repeat CBC, ferritin, ESR, and CRP prior to next visit A total time of 30 minutes was spent preparing to see the patient, reviewing data, obtaining comprehensive interval history, performing medically appropriate physical exam, counseling and educating the patient regarding the above listed issues, arranging follow-up, ordering future blood work, ordering future advanced imaging, and documenting clinical information in the health record

## 2022-02-22 ENCOUNTER — Other Ambulatory Visit: Payer: Self-pay | Admitting: Internal Medicine

## 2022-02-22 DIAGNOSIS — Z1231 Encounter for screening mammogram for malignant neoplasm of breast: Secondary | ICD-10-CM

## 2022-03-12 ENCOUNTER — Other Ambulatory Visit: Payer: Self-pay | Admitting: Internal Medicine

## 2022-04-08 ENCOUNTER — Ambulatory Visit
Admission: RE | Admit: 2022-04-08 | Discharge: 2022-04-08 | Disposition: A | Discharge status: Home / Self Care | Payer: Medicare Other | Source: Ambulatory Visit | Attending: Internal Medicine | Admitting: Internal Medicine

## 2022-04-08 DIAGNOSIS — Z1231 Encounter for screening mammogram for malignant neoplasm of breast: Secondary | ICD-10-CM

## 2022-05-26 ENCOUNTER — Telehealth: Payer: Self-pay | Admitting: Internal Medicine

## 2022-05-26 NOTE — Telephone Encounter (Signed)
Pt not due for labs until closer to her OV which will be due in March. She is aware.

## 2022-05-26 NOTE — Telephone Encounter (Signed)
Received MyChart message from patient stating she wanted to have her iron checked.  Does she need an OV or can these test be ordered for her?  Please call patient and advise.  Thank you.

## 2022-05-26 NOTE — Telephone Encounter (Signed)
Left message for pt to call back.

## 2022-06-10 ENCOUNTER — Telehealth: Payer: Self-pay

## 2022-06-10 DIAGNOSIS — K219 Gastro-esophageal reflux disease without esophagitis: Secondary | ICD-10-CM

## 2022-06-10 DIAGNOSIS — Z860100 Personal history of colon polyps, unspecified: Secondary | ICD-10-CM

## 2022-06-10 DIAGNOSIS — D509 Iron deficiency anemia, unspecified: Secondary | ICD-10-CM

## 2022-06-10 DIAGNOSIS — E538 Deficiency of other specified B group vitamins: Secondary | ICD-10-CM

## 2022-06-10 DIAGNOSIS — R9389 Abnormal findings on diagnostic imaging of other specified body structures: Secondary | ICD-10-CM

## 2022-06-10 NOTE — Telephone Encounter (Signed)
-----  Message from Krystal [name], [place] sent at 02/01/2022 11:41 AM EDT ----- Patient needs ct enterography ([facility]?), labs and office visit end of March 2024

## 2022-06-10 NOTE — Telephone Encounter (Signed)
Patient has an appointment on March 14th.  Labs are entered and order for CT [place] is in.  MyChart message sent to patient reminding her of March appointment and asking her to go to lab the week before.  Message to radiology scheduling asking them to schedule patient for CT Entero at [facility] in early March.

## 2022-06-14 NOTE — Telephone Encounter (Signed)
Patient has read MyChart message and is aware of previous

## 2022-07-07 ENCOUNTER — Inpatient Hospital Stay: Payer: Medicare Other | Attending: Oncology | Admitting: Oncology

## 2022-07-07 ENCOUNTER — Inpatient Hospital Stay: Payer: Medicare Other

## 2022-07-07 VITALS — BP 140/61 | HR 66 | Temp 98.1°F | Resp 18 | Ht 61.0 in | Wt 159.2 lb

## 2022-07-07 DIAGNOSIS — D509 Iron deficiency anemia, unspecified: Secondary | ICD-10-CM

## 2022-07-07 DIAGNOSIS — Z923 Personal history of irradiation: Secondary | ICD-10-CM | POA: Diagnosis not present

## 2022-07-07 DIAGNOSIS — E538 Deficiency of other specified B group vitamins: Secondary | ICD-10-CM | POA: Insufficient documentation

## 2022-07-07 DIAGNOSIS — E119 Type 2 diabetes mellitus without complications: Secondary | ICD-10-CM | POA: Insufficient documentation

## 2022-07-07 DIAGNOSIS — Z853 Personal history of malignant neoplasm of breast: Secondary | ICD-10-CM | POA: Insufficient documentation

## 2022-07-07 LAB — CBC WITH DIFFERENTIAL (CANCER CENTER ONLY)
Abs Immature Granulocytes: 0.02 10*3/uL (ref 0.00–0.07)
Basophils Absolute: 0.1 10*3/uL (ref 0.0–0.1)
Basophils Relative: 1 %
Eosinophils Absolute: 0.1 10*3/uL (ref 0.0–0.5)
Eosinophils Relative: 2 %
HCT: 38.6 % (ref 36.0–46.0)
Hemoglobin: 12.6 g/dL (ref 12.0–15.0)
Immature Granulocytes: 0 %
Lymphocytes Relative: 30 %
Lymphs Abs: 1.6 10*3/uL (ref 0.7–4.0)
MCH: 30.7 pg (ref 26.0–34.0)
MCHC: 32.6 g/dL (ref 30.0–36.0)
MCV: 93.9 fL (ref 80.0–100.0)
Monocytes Absolute: 0.5 10*3/uL (ref 0.1–1.0)
Monocytes Relative: 9 %
Neutro Abs: 3.1 10*3/uL (ref 1.7–7.7)
Neutrophils Relative %: 58 %
Platelet Count: 356 10*3/uL (ref 150–400)
RBC: 4.11 MIL/uL (ref 3.87–5.11)
RDW: 15.2 % (ref 11.5–15.5)
WBC Count: 5.4 10*3/uL (ref 4.0–10.5)
nRBC: 0 % (ref 0.0–0.2)

## 2022-07-07 LAB — FERRITIN: Ferritin: 63 ng/mL (ref 11–307)

## 2022-07-07 NOTE — Progress Notes (Signed)
  [place] OFFICE PROGRESS NOTE   Diagnosis: Iron deficiency anemia  INTERVAL HISTORY:   [name] returns as scheduled.  She is taking iron.  No fever, night sweats, abdominal pain, bleeding, or difficulty with bowel function.  No complaint.  She is scheduled for a CT enterography next week and she will see Dr. Henrene Pastor the following week.  Objective:  Vital signs in last 24 hours:  Blood pressure (!) 140/61, pulse 66, temperature 98.1 F (36.7 C), temperature source Oral, resp. rate 18, height 5' 1"$  (1.549 m), weight 159 lb 3.2 oz (72.2 kg), SpO2 98 %.     Lymphatics: No cervical, supraclavicular, axillary, or inguinal nodes Resp: Lungs clear bilaterally Cardio: Regular rate and rhythm GI: S, nontender, no hepatosplenomegaly Vascular: No leg edema   Lab Results:  Lab Results  Component Value Date   WBC 5.4 07/07/2022   HGB 12.6 07/07/2022   HCT 38.6 07/07/2022   MCV 93.9 07/07/2022   PLT 356 07/07/2022   NEUTROABS 3.1 07/07/2022    CMP  Lab Results  Component Value Date   NA 135 11/12/2015   K 4.1 11/12/2015   CL 101 11/12/2015   CO2 26 11/12/2015   GLUCOSE 125 (H) 11/12/2015   BUN 18 11/12/2015   CREATININE 0.80 12/08/2021   CALCIUM 8.8 (L) 11/12/2015   PROT 7.8 11/12/2015   ALBUMIN 3.7 11/12/2015   AST 19 11/12/2015   ALT 15 11/12/2015   ALKPHOS 85 11/12/2015   BILITOT 0.5 11/12/2015   GFRNONAA >60 11/25/2015   GFRAA >60 11/25/2015     Medications: I have reviewed the patient's current medications.   Assessment/Plan: Iron deficiency anemia 07/14/2021 upper endoscopy-normal esophagus, normal stomach, hiatal hernia without erosions, normal examined duodenum, biopsied duodenal bulb with benign duodenal mucosa with no diagnostic abnormality  07/14/2021 colonoscopy-diverticulosis in the sigmoid colon, normal terminal ileum, small internal hemorrhoids;  07/14/2021 ferritin 5.8 08/03/2021 capsule endoscopy-edematous mucosa and inflammatory  changes starting at 3-hour 6 minutes, continuing in a patchy fashion with multiple ulcerations, edema through the 4-hour mark where severe inflammatory changes and ulceration noted.  CT enterography planned. 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  She completed AC chemotherapy and left breast radiation.  She then completed 5 years of adjuvant Arimidex. B12 deficiency Diabetes       Disposition: Krystal [name] has a history of iron deficiency anemia.  The hemoglobin and ferritin are in the normal range.  She will continue oral iron replacement.  The source of blood loss remains undefined.  She is scheduled for a CT enterography and follow-up with Dr. Henrene Pastor next month.  She will return for an office visit, CBC, and ferritin in 6 months.  I am available to see her sooner as needed.  Betsy Coder, MD  07/07/2022  10:58 AM

## 2022-07-12 ENCOUNTER — Other Ambulatory Visit (INDEPENDENT_AMBULATORY_CARE_PROVIDER_SITE_OTHER): Payer: Medicare Other

## 2022-07-12 ENCOUNTER — Other Ambulatory Visit: Payer: Self-pay | Admitting: Internal Medicine

## 2022-07-12 DIAGNOSIS — K219 Gastro-esophageal reflux disease without esophagitis: Secondary | ICD-10-CM

## 2022-07-12 DIAGNOSIS — E538 Deficiency of other specified B group vitamins: Secondary | ICD-10-CM | POA: Diagnosis not present

## 2022-07-12 DIAGNOSIS — R9389 Abnormal findings on diagnostic imaging of other specified body structures: Secondary | ICD-10-CM

## 2022-07-12 DIAGNOSIS — Z86010 Personal history of colonic polyps: Secondary | ICD-10-CM

## 2022-07-12 DIAGNOSIS — D509 Iron deficiency anemia, unspecified: Secondary | ICD-10-CM

## 2022-07-12 LAB — CBC WITH DIFFERENTIAL/PLATELET
Basophils Absolute: 0.1 10*3/uL (ref 0.0–0.1)
Basophils Relative: 1.3 % (ref 0.0–3.0)
Eosinophils Absolute: 0.2 10*3/uL (ref 0.0–0.7)
Eosinophils Relative: 2.2 % (ref 0.0–5.0)
HCT: 39.1 % (ref 36.0–46.0)
Hemoglobin: 13 g/dL (ref 12.0–15.0)
Lymphocytes Relative: 27 % (ref 12.0–46.0)
Lymphs Abs: 2.2 10*3/uL (ref 0.7–4.0)
MCHC: 33.2 g/dL (ref 30.0–36.0)
MCV: 93.2 fl (ref 78.0–100.0)
Monocytes Absolute: 0.8 10*3/uL (ref 0.1–1.0)
Monocytes Relative: 9.2 % (ref 3.0–12.0)
Neutro Abs: 5 10*3/uL (ref 1.4–7.7)
Neutrophils Relative %: 60.3 % (ref 43.0–77.0)
Platelets: 453 10*3/uL — ABNORMAL HIGH (ref 150.0–400.0)
RBC: 4.2 Mil/uL (ref 3.87–5.11)
RDW: 16.2 % — ABNORMAL HIGH (ref 11.5–15.5)
WBC: 8.3 10*3/uL (ref 4.0–10.5)

## 2022-07-12 LAB — IBC + FERRITIN
Ferritin: 66 ng/mL (ref 10.0–291.0)
Iron: 84 ug/dL (ref 42–145)
Saturation Ratios: 27.5 % (ref 20.0–50.0)
TIBC: 305.2 ug/dL (ref 250.0–450.0)
Transferrin: 218 mg/dL (ref 212.0–360.0)

## 2022-07-12 LAB — SEDIMENTATION RATE: Sed Rate: 68 mm/hr — ABNORMAL HIGH (ref 0–30)

## 2022-07-12 LAB — HIGH SENSITIVITY CRP: CRP, High Sensitivity: 14.8 mg/L — ABNORMAL HIGH (ref 0.000–5.000)

## 2022-07-13 ENCOUNTER — Other Ambulatory Visit (HOSPITAL_BASED_OUTPATIENT_CLINIC_OR_DEPARTMENT_OTHER): Payer: Medicare Other

## 2022-07-15 ENCOUNTER — Ambulatory Visit (HOSPITAL_BASED_OUTPATIENT_CLINIC_OR_DEPARTMENT_OTHER)
Admission: RE | Admit: 2022-07-15 | Discharge: 2022-07-15 | Disposition: A | Discharge status: Home / Self Care | Payer: Medicare Other | Source: Ambulatory Visit | Attending: Internal Medicine | Admitting: Internal Medicine

## 2022-07-15 ENCOUNTER — Encounter (HOSPITAL_BASED_OUTPATIENT_CLINIC_OR_DEPARTMENT_OTHER): Payer: Self-pay

## 2022-07-15 DIAGNOSIS — Z86010 Personal history of colonic polyps: Secondary | ICD-10-CM | POA: Diagnosis present

## 2022-07-15 DIAGNOSIS — R9389 Abnormal findings on diagnostic imaging of other specified body structures: Secondary | ICD-10-CM | POA: Insufficient documentation

## 2022-07-15 DIAGNOSIS — D509 Iron deficiency anemia, unspecified: Secondary | ICD-10-CM | POA: Insufficient documentation

## 2022-07-15 DIAGNOSIS — K219 Gastro-esophageal reflux disease without esophagitis: Secondary | ICD-10-CM | POA: Insufficient documentation

## 2022-07-15 DIAGNOSIS — E538 Deficiency of other specified B group vitamins: Secondary | ICD-10-CM | POA: Insufficient documentation

## 2022-07-15 LAB — POCT I-STAT CREATININE: Creatinine, Ser: 0.9 mg/dL (ref 0.44–1.00)

## 2022-07-15 MED ORDER — IOHEXOL 300 MG/ML  SOLN
100.0000 mL | Freq: Once | INTRAMUSCULAR | Status: AC | PRN
  Administered 2022-07-15: 100 mL via INTRAVENOUS

## 2022-07-22 ENCOUNTER — Encounter: Payer: Self-pay | Admitting: Internal Medicine

## 2022-07-22 ENCOUNTER — Ambulatory Visit: Payer: Medicare Other | Admitting: Internal Medicine

## 2022-07-22 VITALS — BP 130/68 | HR 68 | Ht 60.0 in | Wt 162.0 lb

## 2022-07-22 DIAGNOSIS — D509 Iron deficiency anemia, unspecified: Secondary | ICD-10-CM | POA: Diagnosis not present

## 2022-07-22 DIAGNOSIS — E538 Deficiency of other specified B group vitamins: Secondary | ICD-10-CM | POA: Diagnosis not present

## 2022-07-22 DIAGNOSIS — Z86010 Personal history of colonic polyps: Secondary | ICD-10-CM

## 2022-07-22 DIAGNOSIS — K219 Gastro-esophageal reflux disease without esophagitis: Secondary | ICD-10-CM

## 2022-07-22 DIAGNOSIS — R9389 Abnormal findings on diagnostic imaging of other specified body structures: Secondary | ICD-10-CM | POA: Diagnosis not present

## 2022-07-22 NOTE — Progress Notes (Signed)
HISTORY OF PRESENT ILLNESS:  Krystal [name] is a 72 y.o. female with past medical history as listed below who presents today for follow-up regarding prior history of iron deficiency anemia and abnormal area of jejunum on capsule endoscopy and CT enterography.  She was last seen in this office February 01, 2022.  See that dictation for details.  With B12 and iron replacement her blood counts have normalized.  Throughout all of this, she has been completely asymptomatic without any worrisome clinical issues.  The abnormal area of jejunum was felt possibly to be NSAID induced enteropathy, atypical IBD, neoplasia, or unusual entity otherwise not described.  At the time of her last visit she was told to avoid all NSAIDs with plans to repeat CT enterography in about 6 months as well as follow-up blood work before this visit.   Patient tells me that she continues to remain completely asymptomatic.   She continues on iron and B12 replacement.  CT enterography was repeated 08/15/2022.  There was significant improvement with no radiographic evidence of inflammatory bowel disease or other intestinal pathology.  Mild shotty mesenteric lymphadenopathy had improved since previous exam.  Repeat study in 6 months recommended..  Most recent blood work was unremarkable with normal hemoglobin of 13.0, ferritin of 66, iron saturation of 28%.  She did have elevated inflammatory markers with C-reactive protein of 14.8 and sedimentation rate 68.    REVIEW OF SYSTEMS:  All non-GI ROS negative except for arthritis  Past Medical History:  Diagnosis Date   Anemia    Arthritis    right thumb - no meds   Breast cancer ([place]) 2005   left breast   Cataract    Diabetes mellitus without complication ([place])    type 2   Hearing loss    bilateral - no hearing aids   Hyperlipidemia    Hypertension    Personal history of chemotherapy    Personal history of radiation therapy     Past Surgical History:  Procedure Laterality  Date   BREAST LUMPECTOMY Left 2005   BREAST SURGERY  2005   Left Br Lumpectomy   CESAREAN SECTION  2 times   chemo and radiation  2005-2006   for breast cancer/ 4 chemo treatments and 36 radiation treatments   COLONOSCOPY     x 2   CYSTOSCOPY N/A 11/25/2015   Procedure: CYSTOSCOPY;  Surgeon: Sanjuana Kava, MD;  Location: [facility];  Service: Gynecology;  Laterality: N/A;   LAPAROSCOPIC VAGINAL HYSTERECTOMY WITH SALPINGO OOPHORECTOMY Bilateral 11/25/2015   Procedure: LAPAROSCOPIC ASSISTED VAGINAL HYSTERECTOMY WITH BILATERAL SALPINGO OOPHORECTOMY, LAPARSCOPIC CYSTOTOMY REPAIR WITH BILATERAL RETROGRADES;  Surgeon: Sanjuana Kava, MD;  Location: [facility];  Service: Gynecology;  Laterality: Bilateral;   NASAL SINUS SURGERY     TONSILLECTOMY     TUBAL LIGATION     WISDOM TOOTH EXTRACTION      Social History Krystal [name]  reports that she has never smoked. She has never used smokeless tobacco. She reports that she does not drink alcohol and does not use drugs.  family history includes Breast cancer in her maternal aunt; Heart disease in her father and mother.  No Known Allergies     PHYSICAL EXAMINATION: Vital signs: BP 130/68   Pulse 68   Ht 5' (1.524 m)   Wt 162 lb (73.5 kg)   SpO2 94%   BMI 31.64 kg/m   Constitutional: generally well-appearing, no acute distress Psychiatric: alert and oriented x3, cooperative Eyes: extraocular movements intact, anicteric,  conjunctiva pink Mouth: oral pharynx moist, no lesions Neck: supple no lymphadenopathy Cardiovascular: heart regular rate and rhythm, no murmur Lungs: clear to auscultation bilaterally Abdomen: soft, nontender, nondistended, no obvious ascites, no peritoneal signs, normal bowel sounds, no organomegaly Rectal: Omitted Extremities: no clubbing, cyanosis, or lower extremity edema bilaterally Skin: no lesions on visible extremities Neuro: No focal deficits.  Cranial nerves intact  ASSESSMENT:   1.  Abnormal area of jejunum as  described.  Radiographic abnormality has resolved, save improving lymphadenopathy.  Remains asymptomatic.  Remains under close observation. 2.  History of iron deficiency anemia.  Extensive work-up as previously outlined.  On replacement 3.  B12 deficiency.  On replacement     PLAN:   1.  Continue iron 2.  Continue B12 3.  Office follow-up in 6 months.  She knows to contact the office in the interim for any questions or problems 4.  Repeat CT enterography prior to office follow-up 5.  Repeat CBC, ferritin, ESR, and CRP prior to next visit A total time of 30 minutes was spent preparing to see the patient, reviewing data, obtaining comprehensive interval history, performing medically appropriate physical exam, counseling and educating the patient regarding the above listed issues, arranging follow-up, ordering future blood work, ordering future advanced imaging, and documenting clinical information in the health record

## 2022-07-22 NOTE — Patient Instructions (Signed)
_______________________________________________________  If your blood pressure at your visit was 140/90 or greater, please contact your primary care physician to follow up on this.  _______________________________________________________  If you are age 74 or older, your body mass index should be between 23-30. Your Body mass index is 31.64 kg/m. If this is out of the aforementioned range listed, please consider follow up with your Primary Care Provider.  If you are age 42 or younger, your body mass index should be between 19-25. Your Body mass index is 31.64 kg/m. If this is out of the aformentioned range listed, please consider follow up with your Primary Care Provider.   ________________________________________________________  The [facility] providers would like to encourage you to use [facility] to communicate with providers for non-urgent requests or questions.  Due to long hold times on the telephone, sending your provider a message by [facility] may be a faster and more efficient way to get a response.  Please allow 48 business hours for a response.  Please remember that this is for non-urgent requests.  _______________________________________________________

## 2022-11-10 ENCOUNTER — Other Ambulatory Visit: Payer: Self-pay | Admitting: Internal Medicine

## 2022-11-22 ENCOUNTER — Telehealth: Payer: Self-pay

## 2022-11-22 NOTE — Telephone Encounter (Signed)
Pt calling wanting to know if she can tylenol since she has ulcers. Discussed with pt that she can take tylenol, she verified understanding.

## 2023-01-05 ENCOUNTER — Inpatient Hospital Stay: Payer: Medicare Other | Attending: Oncology

## 2023-01-05 ENCOUNTER — Inpatient Hospital Stay: Payer: Medicare Other | Admitting: Oncology

## 2023-01-05 VITALS — BP 146/74 | HR 78 | Temp 98.1°F | Resp 18 | Ht 60.0 in | Wt 153.0 lb

## 2023-01-05 DIAGNOSIS — E538 Deficiency of other specified B group vitamins: Secondary | ICD-10-CM | POA: Insufficient documentation

## 2023-01-05 DIAGNOSIS — D509 Iron deficiency anemia, unspecified: Secondary | ICD-10-CM

## 2023-01-05 DIAGNOSIS — Z923 Personal history of irradiation: Secondary | ICD-10-CM | POA: Insufficient documentation

## 2023-01-05 DIAGNOSIS — Z9221 Personal history of antineoplastic chemotherapy: Secondary | ICD-10-CM | POA: Diagnosis not present

## 2023-01-05 DIAGNOSIS — Z853 Personal history of malignant neoplasm of breast: Secondary | ICD-10-CM | POA: Diagnosis not present

## 2023-01-05 DIAGNOSIS — E119 Type 2 diabetes mellitus without complications: Secondary | ICD-10-CM | POA: Insufficient documentation

## 2023-01-05 LAB — CBC WITH DIFFERENTIAL (CANCER CENTER ONLY)
Abs Immature Granulocytes: 0.02 10*3/uL (ref 0.00–0.07)
Basophils Absolute: 0 10*3/uL (ref 0.0–0.1)
Basophils Relative: 1 %
Eosinophils Absolute: 0.1 10*3/uL (ref 0.0–0.5)
Eosinophils Relative: 2 %
HCT: 36.7 % (ref 36.0–46.0)
Hemoglobin: 12 g/dL (ref 12.0–15.0)
Immature Granulocytes: 0 %
Lymphocytes Relative: 26 %
Lymphs Abs: 1.6 10*3/uL (ref 0.7–4.0)
MCH: 31.4 pg (ref 26.0–34.0)
MCHC: 32.7 g/dL (ref 30.0–36.0)
MCV: 96.1 fL (ref 80.0–100.0)
Monocytes Absolute: 0.8 10*3/uL (ref 0.1–1.0)
Monocytes Relative: 12 %
Neutro Abs: 3.9 10*3/uL (ref 1.7–7.7)
Neutrophils Relative %: 59 %
Platelet Count: 375 10*3/uL (ref 150–400)
RBC: 3.82 MIL/uL — ABNORMAL LOW (ref 3.87–5.11)
RDW: 14.4 % (ref 11.5–15.5)
WBC Count: 6.4 10*3/uL (ref 4.0–10.5)
nRBC: 0 % (ref 0.0–0.2)

## 2023-01-05 LAB — FERRITIN: Ferritin: 95 ng/mL (ref 11–307)

## 2023-01-05 NOTE — Progress Notes (Signed)
  [facility] OFFICE PROGRESS NOTE   Diagnosis: Anemia, breast cancer  INTERVAL HISTORY:   Ms. Fogelson returns as scheduled.  She feels well.  Good appetite.  She reports intentional weight loss.  No abdominal pain.  No bleeding.  No difficulty with bowel function.  She is taking iron.  Objective:  Vital signs in last 24 hours:  Blood pressure (!) 146/74, pulse 78, temperature 98.1 F (36.7 C), temperature source Oral, resp. rate 18, height 5' (1.524 m), weight 153 lb (69.4 kg), SpO2 99%.    Lymphatics: No cervical, supraclavicular, axillary, or inguinal nodes Resp: Lungs clear bilaterally Cardio: Regular rate and rhythm GI: No hepatosplenomegaly, no mass, nontender Vascular: No leg edema Breast: No mass in either breast.  Firm tissue surrounding the left lumpectomy scar with an oval less than 1 cm mobile component   Lab Results:  Lab Results  Component Value Date   WBC 6.4 01/05/2023   HGB 12.0 01/05/2023   HCT 36.7 01/05/2023   MCV 96.1 01/05/2023   PLT 375 01/05/2023   NEUTROABS 3.9 01/05/2023    CMP  Lab Results  Component Value Date   NA 135 11/12/2015   K 4.1 11/12/2015   CL 101 11/12/2015   CO2 26 11/12/2015   GLUCOSE 125 (H) 11/12/2015   BUN 18 11/12/2015   CREATININE 0.90 07/15/2022   CALCIUM 8.8 (L) 11/12/2015   PROT 7.8 11/12/2015   ALBUMIN 3.7 11/12/2015   AST 19 11/12/2015   ALT 15 11/12/2015   ALKPHOS 85 11/12/2015   BILITOT 0.5 11/12/2015   GFRNONAA >60 11/25/2015   GFRAA >60 11/25/2015    No results found for: "CEA1", "CEA", "RUE454", "CA125"  No results found for: "INR", "LABPROT"  Imaging:  No results found.  Medications: I have reviewed the patient's current medications.   Assessment/Plan: Iron deficiency anemia 07/14/2021 upper endoscopy-normal esophagus, normal stomach, hiatal hernia without erosions, normal examined duodenum, biopsied duodenal bulb with benign duodenal mucosa with no diagnostic abnormality   07/14/2021 colonoscopy-diverticulosis in the sigmoid colon, normal terminal ileum, small internal hemorrhoids;  07/14/2021 ferritin 5.8 08/03/2021 capsule endoscopy-edematous mucosa and inflammatory changes starting at 3-hour 6 minutes, continuing in a patchy fashion with multiple ulcerations, edema through the 4-hour mark where severe inflammatory changes and ulceration noted.  CT enterography planned. CT enterography abdomen/pelvis 08/25/2021-mucosal thickening and enhancement of jejunal loops, numerous enlarged lymph nodes throughout the small bowel mesentery felt to be reactive Feraheme 09/02/2021, 09/09/2021 CT enterography abdomen/pelvis 12/08/2021-no change in evidence of enterocolitis with associated mildly enlarged mesenteric nodes CT enterography abdomen/pelvis 07/15/2022-mild shotty mesenteric lymphadenopathy with mild improvement Stage II left-sided breast cancer diagnosed July 2005.  She completed AC chemotherapy and left breast radiation.  She then completed 5 years of adjuvant Arimidex. B12 deficiency Diabetes     Disposition: Ms. Celedon appears stable.  She is in remission from breast cancer.  The hemoglobin and ferritin remain in the normal range.  She will continue iron.  She continues follow-up with Dr. Marina Goodell to evaluate for source of blood loss and to follow-up on the small bowel changes noted on the capsule endoscopy and CT enterography last year.  Mr. Wambles will return for an office and lab visit in 6 months.  She continues yearly mammography.  Thornton Papas, MD  01/05/2023  10:14 AM

## 2023-02-23 ENCOUNTER — Other Ambulatory Visit: Payer: Self-pay | Admitting: Internal Medicine

## 2023-02-23 DIAGNOSIS — Z1231 Encounter for screening mammogram for malignant neoplasm of breast: Secondary | ICD-10-CM

## 2023-03-14 ENCOUNTER — Telehealth: Payer: Self-pay

## 2023-03-14 DIAGNOSIS — D509 Iron deficiency anemia, unspecified: Secondary | ICD-10-CM

## 2023-03-14 DIAGNOSIS — R9389 Abnormal findings on diagnostic imaging of other specified body structures: Secondary | ICD-10-CM

## 2023-03-14 NOTE — Telephone Encounter (Signed)
Spoke to patient and let her know I scheduled her CT enterography and put in orders for labs.  She will come by today to get those done before the CT on Wednesday.  We also scheduled a follow up with Dr. Marina Goodell in January.  Patient understood.

## 2023-03-14 NOTE — Telephone Encounter (Signed)
Lm for patient that its time to schedule ct enterography, labs, and office follow up

## 2023-03-14 NOTE — Telephone Encounter (Signed)
-----   Message from [facility] [place] S sent at 07/22/2022  4:03 PM EDT ----- Ct enterography ([place]) and labs and office visit in 6 months.

## 2023-03-15 ENCOUNTER — Other Ambulatory Visit (INDEPENDENT_AMBULATORY_CARE_PROVIDER_SITE_OTHER): Payer: Medicare Other

## 2023-03-15 DIAGNOSIS — D509 Iron deficiency anemia, unspecified: Secondary | ICD-10-CM

## 2023-03-15 DIAGNOSIS — R9389 Abnormal findings on diagnostic imaging of other specified body structures: Secondary | ICD-10-CM

## 2023-03-15 LAB — CBC WITH DIFFERENTIAL/PLATELET
Basophils Absolute: 0.1 10*3/uL (ref 0.0–0.1)
Basophils Relative: 0.9 % (ref 0.0–3.0)
Eosinophils Absolute: 0.1 10*3/uL (ref 0.0–0.7)
Eosinophils Relative: 1.5 % (ref 0.0–5.0)
HCT: 37.7 % (ref 36.0–46.0)
Hemoglobin: 12.3 g/dL (ref 12.0–15.0)
Lymphocytes Relative: 18.4 % (ref 12.0–46.0)
Lymphs Abs: 1.4 10*3/uL (ref 0.7–4.0)
MCHC: 32.6 g/dL (ref 30.0–36.0)
MCV: 95.7 fL (ref 78.0–100.0)
Monocytes Absolute: 1.1 10*3/uL — ABNORMAL HIGH (ref 0.1–1.0)
Monocytes Relative: 14.4 % — ABNORMAL HIGH (ref 3.0–12.0)
Neutro Abs: 5.1 10*3/uL (ref 1.4–7.7)
Neutrophils Relative %: 64.8 % (ref 43.0–77.0)
Platelets: 524 10*3/uL — ABNORMAL HIGH (ref 150.0–400.0)
RBC: 3.94 Mil/uL (ref 3.87–5.11)
RDW: 15.8 % — ABNORMAL HIGH (ref 11.5–15.5)
WBC: 7.9 10*3/uL (ref 4.0–10.5)

## 2023-03-15 LAB — BASIC METABOLIC PANEL
BUN: 14 mg/dL (ref 6–23)
CO2: 30 meq/L (ref 19–32)
Calcium: 8.9 mg/dL (ref 8.4–10.5)
Chloride: 96 meq/L (ref 96–112)
Creatinine, Ser: 0.85 mg/dL (ref 0.40–1.20)
GFR: 67.35 mL/min (ref >60.00)
Glucose, Bld: 92 mg/dL (ref 70–99)
Potassium: 3.4 meq/L — ABNORMAL LOW (ref 3.5–5.1)
Sodium: 136 meq/L (ref 135–145)

## 2023-03-15 LAB — FERRITIN: Ferritin: 126.4 ng/mL (ref 10.0–291.0)

## 2023-03-15 LAB — SEDIMENTATION RATE: Sed Rate: 98 mm/h — ABNORMAL HIGH (ref 0–30)

## 2023-03-15 LAB — HIGH SENSITIVITY CRP: CRP, High Sensitivity: 44.89 mg/L — ABNORMAL HIGH (ref 0.000–5.000)

## 2023-03-16 ENCOUNTER — Ambulatory Visit (HOSPITAL_COMMUNITY)
Admission: RE | Admit: 2023-03-16 | Discharge: 2023-03-16 | Disposition: A | Discharge status: Home / Self Care | Payer: Medicare Other | Source: Ambulatory Visit | Attending: Internal Medicine | Admitting: Internal Medicine

## 2023-03-16 DIAGNOSIS — D509 Iron deficiency anemia, unspecified: Secondary | ICD-10-CM | POA: Diagnosis present

## 2023-03-16 DIAGNOSIS — R9389 Abnormal findings on diagnostic imaging of other specified body structures: Secondary | ICD-10-CM | POA: Diagnosis present

## 2023-03-16 MED ORDER — BARIUM SULFATE 0.1 % PO SUSP
450.0000 mL | Freq: Once | ORAL | Status: AC
  Administered 2023-03-16: 1350 mL via ORAL

## 2023-03-16 MED ORDER — IOHEXOL 300 MG/ML  SOLN
100.0000 mL | Freq: Once | INTRAMUSCULAR | Status: AC | PRN
  Administered 2023-03-16: 100 mL via INTRAVENOUS

## 2023-03-16 MED ORDER — BARIUM SULFATE 0.1 % PO SUSP
ORAL | Status: AC
  Filled 2023-03-16: qty 3

## 2023-04-09 IMAGING — CT CT ENTEROGRAPHY (ABD-PELV W/ CM)
2 of 6 series · 16 of 46 positions shown, 18 images · IV contrast (APPLIED)
Comparison: None.

CLINICAL DATA: Iron deficiency anemia.  Abnormal capsule endoscopy.

EXAM:
CT ABDOMEN AND PELVIS WITH CONTRAST (ENTEROGRAPHY)
TECHNIQUE: Multidetector CT of the abdomen and pelvis during bolus
administration of intravenous contrast. Negative oral contrast was
given.

[Series 3: entero thins · axial · 0.79mm/px · z∈[-442,-64]mm · 13 of 219 slices shown, 15 images]
[im 15/219  soft-tissue]
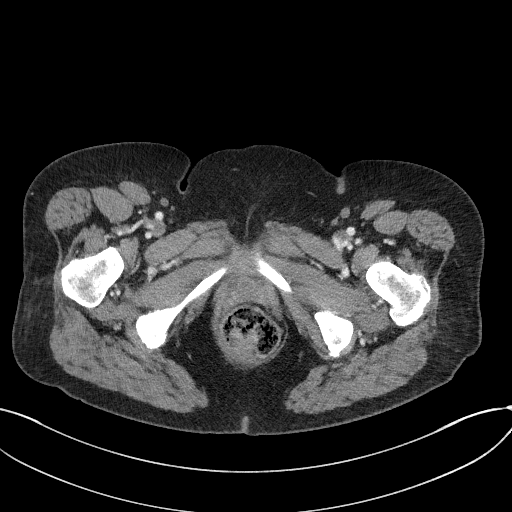
[im 15/219  bone]
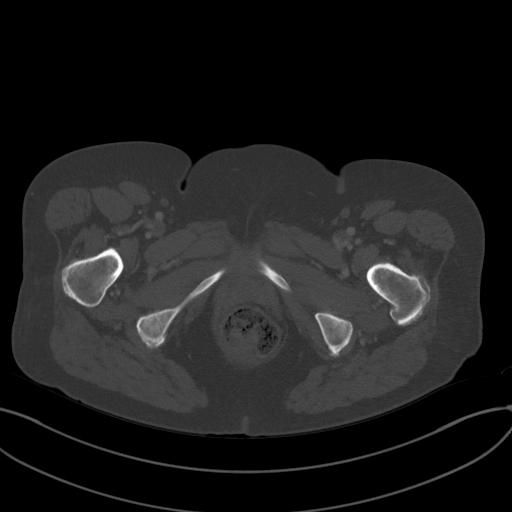
[im 30/219  soft-tissue]
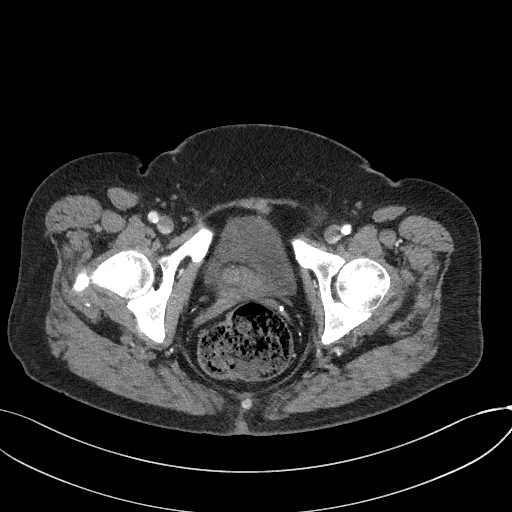
[im 44/219  soft-tissue]
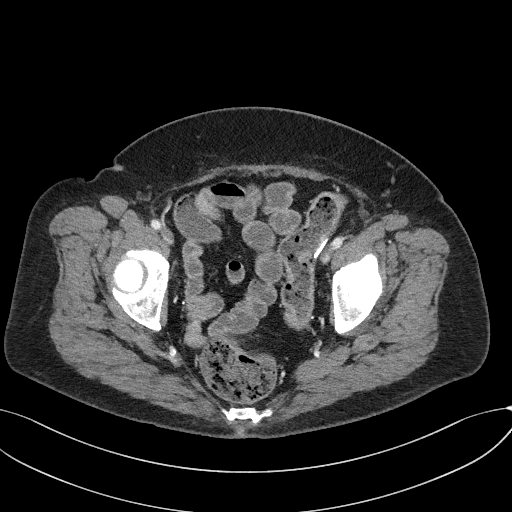
[im 59/219  soft-tissue]
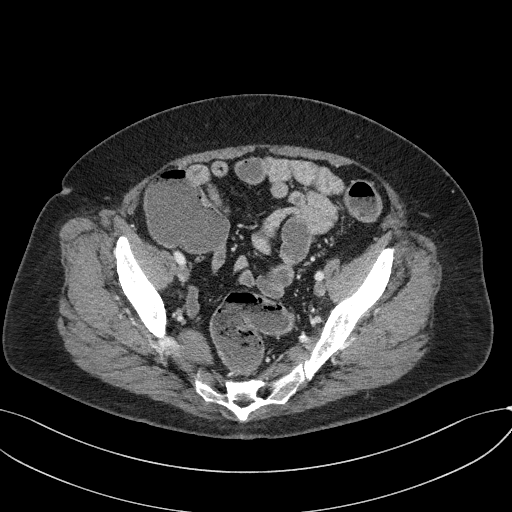
[im 73/219  soft-tissue]
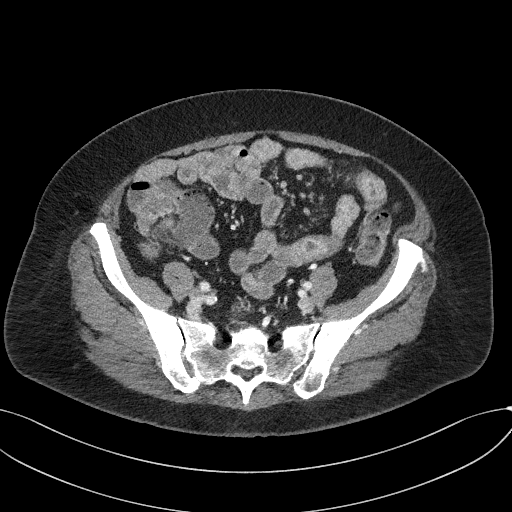
[im 88/219  soft-tissue]
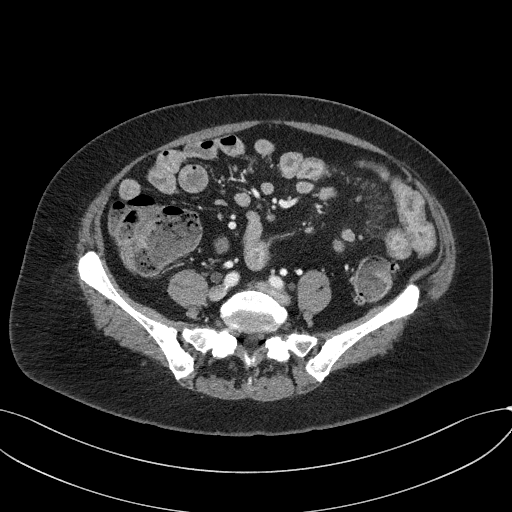
[im 117/219  soft-tissue]
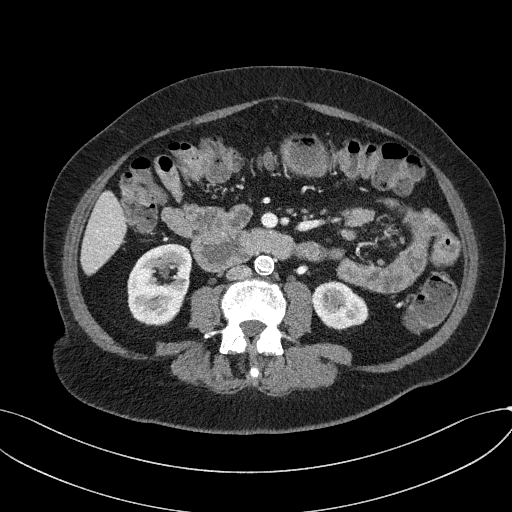
[im 131/219  soft-tissue]
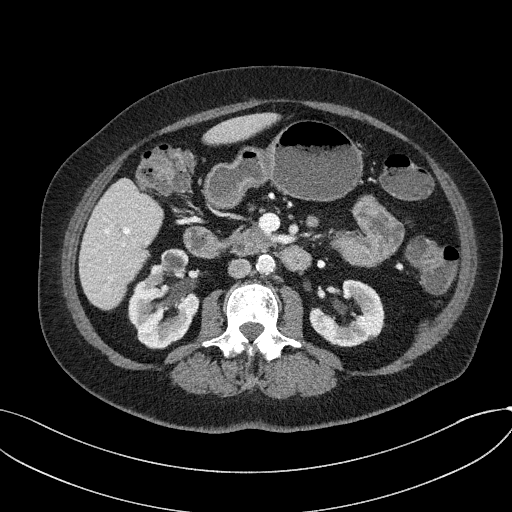
[im 146/219  soft-tissue]
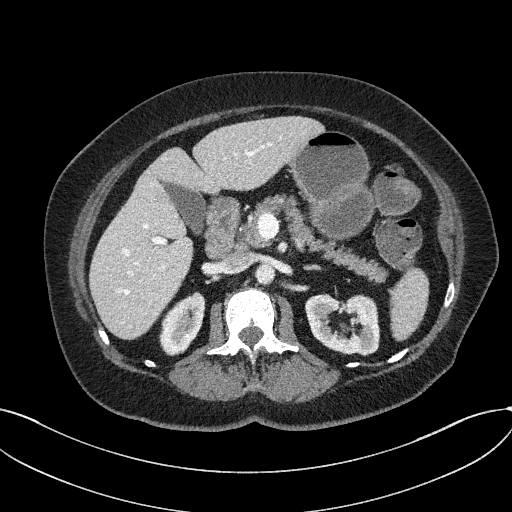
[im 146/219  bone]
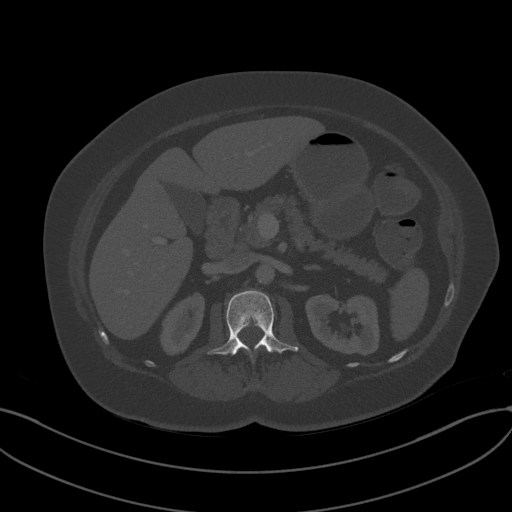
[im 160/219  soft-tissue]
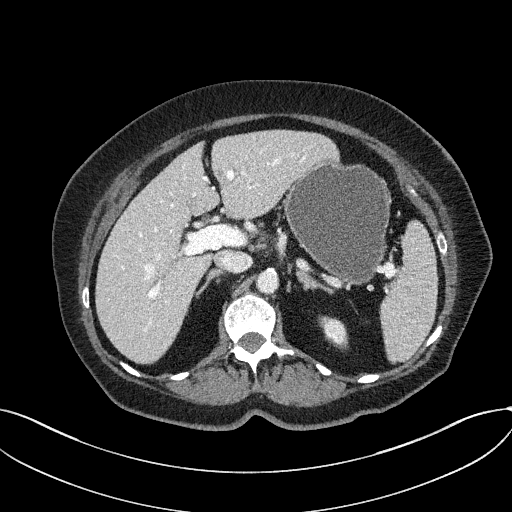
[im 175/219  soft-tissue]
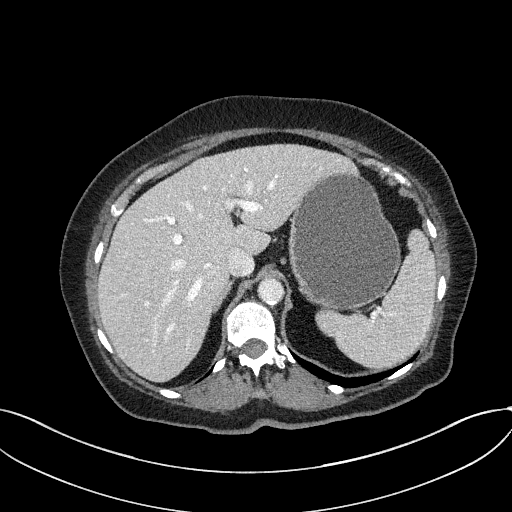
[im 189/219  soft-tissue]
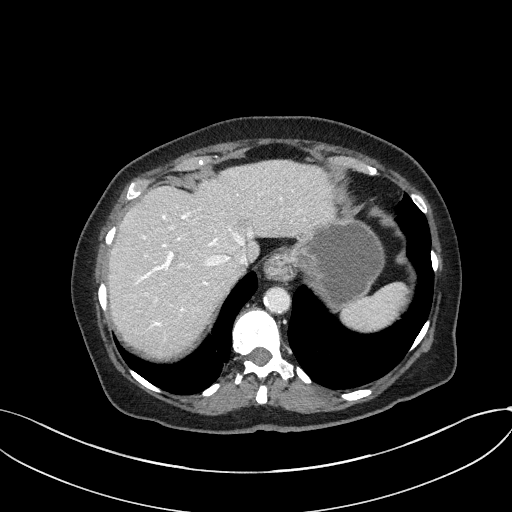
[im 204/219  soft-tissue]
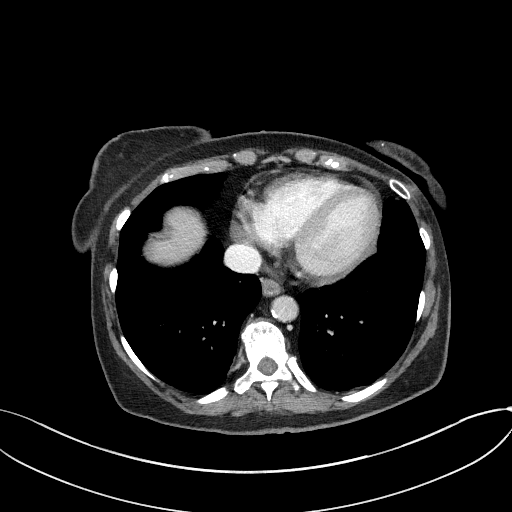

[Series 6: coronal · coronal · 0.84mm/px · 3 of 88 slices shown]
[im 30/88  soft-tissue]
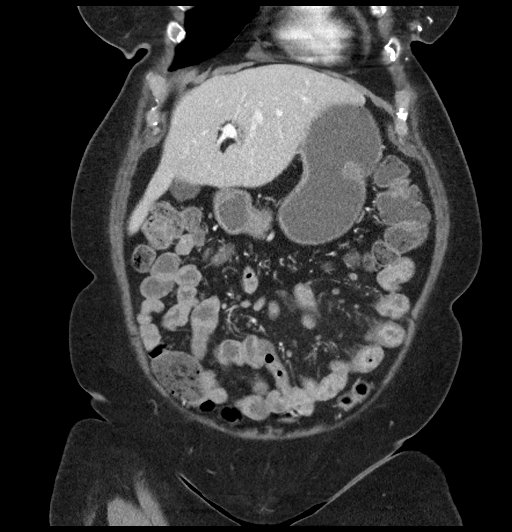
[im 39/88  soft-tissue]
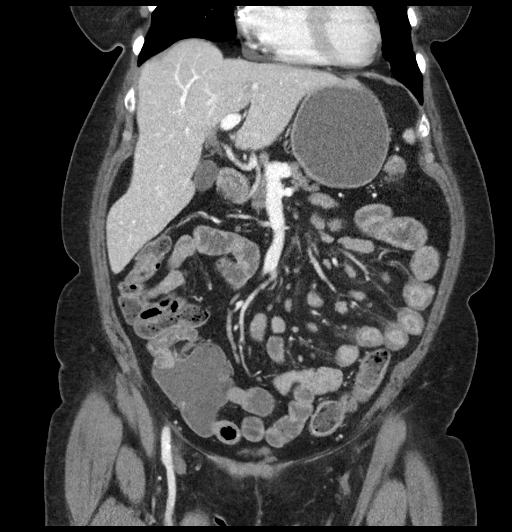
[im 49/88  soft-tissue]
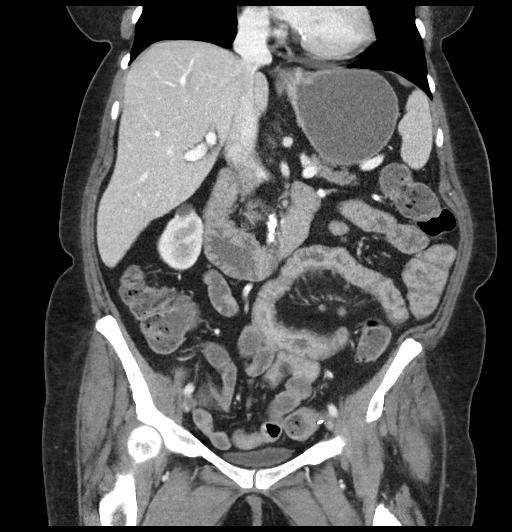

[16 of 46 positions shown; findings below may reference images not displayed]

RADIATION DOSE REDUCTION: This exam was performed according to the
departmental dose-optimization program which includes automated
exposure control, adjustment of the mA and/or kV according to
patient size and/or use of iterative reconstruction technique.

CONTRAST:  100mL OMNIPAQUE IOHEXOL 300 MG/ML  SOLN
FINDINGS: Lower chest: The lung bases are clear of acute process. No pleural
effusion or pulmonary lesions. The heart is normal in size. No
pericardial effusion. Aortic and coronary artery calcifications are
noted. Small hiatal hernia.

Hepatobiliary: No hepatic lesions or intrahepatic biliary
dilatation. The gallbladder is unremarkable. No common bile duct
dilatation.

Pancreas: No mass, inflammation or ductal dilatation.

Spleen: Normal size.  No focal lesions.

Adrenals/Urinary Tract: The adrenal glands are normal.

Simple bilateral renal cysts not requiring follow-up. Renal cortical
scarring changes involving the right kidney possibly due to prior
infection or obstruction. Small right renal calculus. No worrisome
renal lesions or hydronephrosis. No ureteral or bladder calculi.

Stomach/Bowel: The stomach is unremarkable. The duodenum is
unremarkable. The jejunal loops of small bowel demonstrate moderate
mucosal thickening and enhancement. The mid and distal ileum appear
normal. The terminal ileum is normal. No colonic inflammatory
process is identified. No colonic mass or obstruction.

Vascular/Lymphatic: Moderate atherosclerotic calcifications
involving the aorta and iliac arteries but no aneurysm or
dissection. The major venous structures are patent.

There are many enlarged lymph nodes throughout the small bowel
mesentery I think these are most likely reactive/inflammatory given
the small bowel findings. No retroperitoneal or pelvic adenopathy or
inguinal adenopathy to suggest lymphoma. It may be worthwhile
getting a follow-up CT scan after appropriate treatment of the
patient's small bowel process to make sure these nodes regress.

Reproductive: Surgically absent.

Other: No pelvic mass or adenopathy. No free pelvic fluid
collections. No inguinal mass or adenopathy. No abdominal wall
hernia or subcutaneous lesions.

Musculoskeletal: No significant bony findings.
IMPRESSION: 1. Moderate mucosal thickening and enhancement of the jejunal loops
suggesting an inflammatory or infectious enteritis. The mid distal
ileum and terminal ileum are normal.
2. Numerous enlarged lymph nodes throughout the small bowel
mesentery, most likely reactive/inflammatory given the small bowel
findings. It may be worthwhile getting a follow-up CT scan after
appropriate treatment of the patient's small bowel process to make
sure these nodes regress.
3. No colonic mass or obstruction.

Aortic Atherosclerosis ([id number]-Z34.4).

## 2023-04-27 ENCOUNTER — Ambulatory Visit (HOSPITAL_BASED_OUTPATIENT_CLINIC_OR_DEPARTMENT_OTHER)
Admission: RE | Admit: 2023-04-27 | Discharge: 2023-04-27 | Disposition: A | Discharge status: Home / Self Care | Payer: Medicare Other | Source: Ambulatory Visit | Attending: Internal Medicine | Admitting: Internal Medicine

## 2023-04-27 ENCOUNTER — Encounter (HOSPITAL_BASED_OUTPATIENT_CLINIC_OR_DEPARTMENT_OTHER): Payer: Self-pay | Admitting: Radiology

## 2023-04-27 DIAGNOSIS — Z1231 Encounter for screening mammogram for malignant neoplasm of breast: Secondary | ICD-10-CM | POA: Insufficient documentation

## 2023-06-01 ENCOUNTER — Other Ambulatory Visit (INDEPENDENT_AMBULATORY_CARE_PROVIDER_SITE_OTHER): Payer: Medicare Other

## 2023-06-01 ENCOUNTER — Ambulatory Visit: Payer: Medicare Other | Admitting: Internal Medicine

## 2023-06-01 ENCOUNTER — Encounter: Payer: Self-pay | Admitting: Internal Medicine

## 2023-06-01 VITALS — BP 122/70 | HR 87 | Ht 60.0 in | Wt 147.0 lb

## 2023-06-01 DIAGNOSIS — K529 Noninfective gastroenteritis and colitis, unspecified: Secondary | ICD-10-CM

## 2023-06-01 DIAGNOSIS — K6389 Other specified diseases of intestine: Secondary | ICD-10-CM | POA: Diagnosis not present

## 2023-06-01 DIAGNOSIS — D509 Iron deficiency anemia, unspecified: Secondary | ICD-10-CM | POA: Diagnosis not present

## 2023-06-01 DIAGNOSIS — R9389 Abnormal findings on diagnostic imaging of other specified body structures: Secondary | ICD-10-CM

## 2023-06-01 DIAGNOSIS — E538 Deficiency of other specified B group vitamins: Secondary | ICD-10-CM

## 2023-06-01 DIAGNOSIS — R591 Generalized enlarged lymph nodes: Secondary | ICD-10-CM

## 2023-06-01 DIAGNOSIS — K219 Gastro-esophageal reflux disease without esophagitis: Secondary | ICD-10-CM

## 2023-06-01 LAB — CBC WITH DIFFERENTIAL/PLATELET
Basophils Absolute: 0 10*3/uL (ref 0.0–0.1)
Basophils Relative: 0.8 % (ref 0.0–3.0)
Eosinophils Absolute: 0.1 10*3/uL (ref 0.0–0.7)
Eosinophils Relative: 1.3 % (ref 0.0–5.0)
HCT: 38.4 % (ref 36.0–46.0)
Hemoglobin: 12.6 g/dL (ref 12.0–15.0)
Lymphocytes Relative: 22.3 % (ref 12.0–46.0)
Lymphs Abs: 1.4 10*3/uL (ref 0.7–4.0)
MCHC: 32.7 g/dL (ref 30.0–36.0)
MCV: 95.9 fL (ref 78.0–100.0)
Monocytes Absolute: 0.7 10*3/uL (ref 0.1–1.0)
Monocytes Relative: 11.5 % (ref 3.0–12.0)
Neutro Abs: 4.1 10*3/uL (ref 1.4–7.7)
Neutrophils Relative %: 64.1 % (ref 43.0–77.0)
Platelets: 532 10*3/uL — ABNORMAL HIGH (ref 150.0–400.0)
RBC: 4 Mil/uL (ref 3.87–5.11)
RDW: 15.6 % — ABNORMAL HIGH (ref 11.5–15.5)
WBC: 6.4 10*3/uL (ref 4.0–10.5)

## 2023-06-01 LAB — HIGH SENSITIVITY CRP: CRP, High Sensitivity: 62.34 mg/L — ABNORMAL HIGH (ref 0.000–5.000)

## 2023-06-01 LAB — FERRITIN: Ferritin: 107 ng/mL (ref 10.0–291.0)

## 2023-06-01 LAB — SEDIMENTATION RATE: Sed Rate: 98 mm/h — ABNORMAL HIGH (ref 0–30)

## 2023-06-01 NOTE — Patient Instructions (Signed)
Your provider has requested that you go to the basement level for lab work before leaving today. Press "B" on the elevator. The lab is located at the first door on the left as you exit the elevator.   I will call you in a few months to schedule your CT enterography.  _______________________________________________________  If your blood pressure at your visit was 140/90 or greater, please contact your primary care physician to follow up on this.  _______________________________________________________  If you are age 5 or older, your body mass index should be between 23-30. Your Body mass index is 28.71 kg/m. If this is out of the aforementioned range listed, please consider follow up with your Primary Care Provider.  If you are age 31 or younger, your body mass index should be between 19-25. Your Body mass index is 28.71 kg/m. If this is out of the aformentioned range listed, please consider follow up with your Primary Care Provider.   ________________________________________________________  The [facility] providers would like to encourage you to use [facility] to communicate with providers for non-urgent requests or questions.  Due to long hold times on the telephone, sending your provider a message by [facility] may be a faster and more efficient way to get a response.  Please allow 48 business hours for a response.  Please remember that this is for non-urgent requests.  _______________________________________________________

## 2023-06-01 NOTE — Progress Notes (Signed)
HISTORY OF PRESENT ILLNESS:  Krystal [name] is a 73 y.o. female with past medical history as listed below who presents today for follow-up regarding prior history of iron deficiency anemia and abnormal area of jejunum on capsule endoscopy and CT enterography.  She was last seen in this office July 22, 2022.  See that dictation for details.  With B12 and iron replacement her blood counts have remained normalized.  Throughout all of this, she has been completely asymptomatic without any worrisome clinical issues.  The abnormal area of jejunum was felt possibly to be NSAID induced enteropathy, atypical IBD, neoplasia, or unusual entity otherwise not described.  She has been avoiding NSAIDs.  She presents today for follow-up.   Patient tells me that she continues to remain completely asymptomatic.   She continues on iron and B12 replacement.  CT enterography was repeated March 16, 2023.  There was ongoing focal small bowel inflammatory change mild shotty mesenteric lymphadenopathy.  Repeat study in 6 months recommended..  Most recent blood work (March 15, 2023) was unremarkable with normal hemoglobin of 12.3, ferritin of 126.  She did have worsening elevated inflammatory markers with C-reactive protein of 44.9 and sedimentation rate 98.  She tells me that she has lost about 8 pounds since her last visit.  She tells me that she has been eating less, particularly at night.  However, this is not due to abdominal distress or abdominal symptoms such as pain, bloating, or nausea.  REVIEW OF SYSTEMS:  All non-GI ROS negative except for right shoulder and hand discomfort  Past Medical History:  Diagnosis Date   Anemia    Arthritis    right thumb - no meds   Breast cancer ([facility]) 2005   left breast   Cataract    Diabetes mellitus without complication ([facility])    type 2   Hearing loss    bilateral - no hearing aids   Hyperlipidemia    Hypertension    Personal history of chemotherapy    Personal history  of radiation therapy     Past Surgical History:  Procedure Laterality Date   BREAST LUMPECTOMY Left 2005   BREAST SURGERY  2005   Left Br Lumpectomy   CESAREAN SECTION  2 times   chemo and radiation  2005-2006   for breast cancer/ 4 chemo treatments and 36 radiation treatments   COLONOSCOPY     x 2   CYSTOSCOPY N/A 11/25/2015   Procedure: CYSTOSCOPY;  Surgeon: Essie Hart, MD;  Location: [facility];  Service: Gynecology;  Laterality: N/A;   LAPAROSCOPIC VAGINAL HYSTERECTOMY WITH SALPINGO OOPHORECTOMY Bilateral 11/25/2015   Procedure: LAPAROSCOPIC ASSISTED VAGINAL HYSTERECTOMY WITH BILATERAL SALPINGO OOPHORECTOMY, LAPARSCOPIC CYSTOTOMY REPAIR WITH BILATERAL RETROGRADES;  Surgeon: Essie Hart, MD;  Location: [facility];  Service: Gynecology;  Laterality: Bilateral;   NASAL SINUS SURGERY     TONSILLECTOMY     TUBAL LIGATION     WISDOM TOOTH EXTRACTION      Social History Krystal [name]  reports that she has never smoked. She has never used smokeless tobacco. She reports that she does not drink alcohol and does not use drugs.  family history includes Breast cancer in her maternal aunt; Heart disease in her father and mother.  No Known Allergies     PHYSICAL EXAMINATION: Vital signs: BP 122/70   Pulse 87   Ht 5' (1.524 m)   Wt 147 lb (66.7 kg)   BMI 28.71 kg/m   Constitutional: generally well-appearing, no acute distress  Psychiatric: alert and oriented x3, cooperative Eyes: extraocular movements intact, anicteric, conjunctiva pink Mouth: oral pharynx moist, no lesions Neck: supple no lymphadenopathy Cardiovascular: heart regular rate and rhythm, no murmur Lungs: clear to auscultation bilaterally Abdomen: soft, nontender, nondistended, no obvious ascites, no peritoneal signs, normal bowel sounds, no organomegaly Rectal: Omitted Extremities: no clubbing, cyanosis, or lower extremity edema bilaterally Skin: no lesions on visible extremities Neuro: No focal deficits.  Cranial nerves  intact  ASSESSMENT:   1.  Abnormal area of jejunum as described.  Radiographic abnormality has improved, with reactive appearing lymphadenopathy.  Remains asymptomatic.  Remains under close observation. 2.  History of iron deficiency anemia.  Extensive work-up as previously outlined.  On replacement with normal hemoglobin 3.  B12 deficiency.  On replacement with normal hemoglobin     PLAN:   1.  Continue iron 2.  Continue B12 3.  Continue to avoid NSAIDs. 4.  Office follow-up in 6 months.  She knows to contact the office in the interim for any questions or problems 5.  Repeat CT enterography prior to office follow-up, in May 6.  Repeat CBC, ferritin, ESR, and CRP today and prior to next visit A total time of 30 minutes was spent preparing to see the patient, reviewing data, obtaining comprehensive interval history, performing medically appropriate physical exam, counseling and educating the patient regarding the above listed issues, arranging follow-up, ordering future blood work, ordering future advanced imaging, and documenting clinical information in the health record  Addendum: CBC unremarkable.  Ferritin normal.  Inflammatory markers remain elevated.  Patient notified

## 2023-07-08 ENCOUNTER — Other Ambulatory Visit: Payer: Medicare Other

## 2023-07-08 ENCOUNTER — Inpatient Hospital Stay: Payer: Medicare Other | Attending: Oncology | Admitting: Oncology

## 2023-07-08 ENCOUNTER — Inpatient Hospital Stay: Payer: Medicare Other

## 2023-07-08 VITALS — BP 118/60 | HR 81 | Temp 98.1°F | Resp 18 | Ht 60.0 in | Wt 145.8 lb

## 2023-07-08 DIAGNOSIS — Z923 Personal history of irradiation: Secondary | ICD-10-CM | POA: Insufficient documentation

## 2023-07-08 DIAGNOSIS — Z853 Personal history of malignant neoplasm of breast: Secondary | ICD-10-CM | POA: Diagnosis not present

## 2023-07-08 DIAGNOSIS — D509 Iron deficiency anemia, unspecified: Secondary | ICD-10-CM

## 2023-07-08 DIAGNOSIS — E119 Type 2 diabetes mellitus without complications: Secondary | ICD-10-CM | POA: Diagnosis not present

## 2023-07-08 DIAGNOSIS — Z9221 Personal history of antineoplastic chemotherapy: Secondary | ICD-10-CM | POA: Insufficient documentation

## 2023-07-08 DIAGNOSIS — E538 Deficiency of other specified B group vitamins: Secondary | ICD-10-CM | POA: Insufficient documentation

## 2023-07-08 DIAGNOSIS — R918 Other nonspecific abnormal finding of lung field: Secondary | ICD-10-CM | POA: Insufficient documentation

## 2023-07-08 LAB — CBC WITH DIFFERENTIAL (CANCER CENTER ONLY)
Abs Immature Granulocytes: 0.01 10*3/uL (ref 0.00–0.07)
Basophils Absolute: 0 10*3/uL (ref 0.0–0.1)
Basophils Relative: 1 %
Eosinophils Absolute: 0.1 10*3/uL (ref 0.0–0.5)
Eosinophils Relative: 1 %
HCT: 37.6 % (ref 36.0–46.0)
Hemoglobin: 12 g/dL (ref 12.0–15.0)
Immature Granulocytes: 0 %
Lymphocytes Relative: 22 %
Lymphs Abs: 1.1 10*3/uL (ref 0.7–4.0)
MCH: 30.5 pg (ref 26.0–34.0)
MCHC: 31.9 g/dL (ref 30.0–36.0)
MCV: 95.4 fL (ref 80.0–100.0)
Monocytes Absolute: 0.6 10*3/uL (ref 0.1–1.0)
Monocytes Relative: 11 %
Neutro Abs: 3.2 10*3/uL (ref 1.7–7.7)
Neutrophils Relative %: 65 %
Platelet Count: 427 10*3/uL — ABNORMAL HIGH (ref 150–400)
RBC: 3.94 MIL/uL (ref 3.87–5.11)
RDW: 14 % (ref 11.5–15.5)
WBC Count: 4.9 10*3/uL (ref 4.0–10.5)
nRBC: 0 % (ref 0.0–0.2)

## 2023-07-08 LAB — FERRITIN: Ferritin: 108 ng/mL (ref 11–307)

## 2023-07-08 NOTE — Progress Notes (Signed)
 [facility] OFFICE PROGRESS NOTE   Diagnosis: Iron deficiency anemia, breast cancer  INTERVAL HISTORY:   Krystal [name] returns as scheduled.  She feels well.  Good appetite.  No nausea, diarrhea, abdominal pain, or bleeding.  She continues iron. She was referred for CTs by Dr. Jacky Kindle for evaluation of weight loss.  She reports recent episodes of hypoglycemia at night.  The metformin was decreased.  Objective:  Vital signs in last 24 hours:  Blood pressure 118/60, pulse 81, temperature 98.1 F (36.7 C), temperature source Temporal, resp. rate 18, height 5' (1.524 m), weight 145 lb 12.8 oz (66.1 kg), SpO2 100%.     Lymphatics: No cervical, supraclavicular, axillary, or inguinal nodes Resp: Lungs clear bilaterally Cardio: Regular rate and rhythm GI: No hepatosplenomegaly, no mass, nontender Vascular: No leg edema Breast: No mass in either breast.  Firm tissue surrounding the left lumpectomy scar with a 1 cm oval mobile area superior to the scar      Lab Results:  Lab Results  Component Value Date   WBC 4.9 07/08/2023   HGB 12.0 07/08/2023   HCT 37.6 07/08/2023   MCV 95.4 07/08/2023   PLT 427 (H) 07/08/2023   NEUTROABS 3.2 07/08/2023    CMP  Lab Results  Component Value Date   NA 136 03/15/2023   K 3.4 (L) 03/15/2023   CL 96 03/15/2023   CO2 30 03/15/2023   GLUCOSE 92 03/15/2023   BUN 14 03/15/2023   CREATININE 0.85 03/15/2023   CALCIUM 8.9 03/15/2023   PROT 7.8 11/12/2015   ALBUMIN 3.7 11/12/2015   AST 19 11/12/2015   ALT 15 11/12/2015   ALKPHOS 85 11/12/2015   BILITOT 0.5 11/12/2015   GFRNONAA >60 11/25/2015   GFRAA >60 11/25/2015    Medications: I have reviewed the patient's current medications.   Assessment/Plan: Iron deficiency anemia 07/14/2021 upper endoscopy-normal esophagus, normal stomach, hiatal hernia without erosions, normal examined duodenum, biopsied duodenal bulb with benign duodenal mucosa with no diagnostic abnormality   07/14/2021 colonoscopy-diverticulosis in the sigmoid colon, normal terminal ileum, small internal hemorrhoids;  07/14/2021 ferritin 5.8 08/03/2021 capsule endoscopy-edematous mucosa and inflammatory changes starting at 3-hour 6 minutes, continuing in a patchy fashion with multiple ulcerations, edema through the 4-hour mark where severe inflammatory changes and ulceration noted.  CT enterography planned. CT enterography abdomen/pelvis 08/25/2021-mucosal thickening and enhancement of jejunal loops, numerous enlarged lymph nodes throughout the small bowel mesentery felt to be reactive Feraheme 09/02/2021, 09/09/2021 CT enterography abdomen/pelvis 12/08/2021-no change in evidence of enterocolitis with associated mildly enlarged mesenteric nodes CT enterography abdomen/pelvis 07/15/2022-mild shotty mesenteric lymphadenopathy with mild improvement CT abdomen/pelvis at [facility] 07/06/2023: Mild wall thickening and inflammation associated with a segment of jejunum, mild enlargement of mesenteric lymph nodes, nodules up to 4 mm at the base of the right upper lobe Stage II left-sided breast cancer diagnosed July 2005.  She completed AC chemotherapy and left breast radiation.  She then completed 5 years of adjuvant Arimidex. B12 deficiency Diabetes     Disposition: Krystal [name] is in clinical remission from breast cancer.  The hemois normal and the platelet count is mildly elevated.  She will continue iron.  She will continue follow-up with Dr. Marina Goodell and Dr. Jacky Kindle for evaluation of the abdominal CT findings.  Tiny lung nodules were noted in the CT at [facility] this week.  She can follow-up with Dr. Jacky Kindle for further evaluation  She will return for an office and lab visit in 6 months.  Thornton Papas,  MD  07/08/2023  12:00 PM

## 2023-09-13 ENCOUNTER — Telehealth: Payer: Self-pay

## 2023-09-13 NOTE — Telephone Encounter (Signed)
 Spoke to scheduling who is going to call patient to schedule CT Enterography

## 2023-09-13 NOTE — Telephone Encounter (Signed)
-----   Message from [facility] sent at 06/01/2023 10:56 AM EST ----- Patient needs ct enterography at [facility] in May

## 2023-09-23 ENCOUNTER — Ambulatory Visit (HOSPITAL_BASED_OUTPATIENT_CLINIC_OR_DEPARTMENT_OTHER)
Admission: RE | Admit: 2023-09-23 | Discharge: 2023-09-23 | Disposition: A | Discharge status: Home / Self Care | Source: Ambulatory Visit | Attending: Internal Medicine | Admitting: Internal Medicine

## 2023-09-23 DIAGNOSIS — K529 Noninfective gastroenteritis and colitis, unspecified: Secondary | ICD-10-CM | POA: Insufficient documentation

## 2023-09-23 DIAGNOSIS — D509 Iron deficiency anemia, unspecified: Secondary | ICD-10-CM | POA: Diagnosis present

## 2023-09-23 MED ORDER — IOHEXOL 300 MG/ML  SOLN
100.0000 mL | Freq: Once | INTRAMUSCULAR | Status: AC | PRN
  Administered 2023-09-23: 100 mL via INTRAVENOUS

## 2023-09-26 ENCOUNTER — Ambulatory Visit: Payer: Self-pay | Admitting: Internal Medicine

## 2023-11-29 ENCOUNTER — Encounter (HOSPITAL_BASED_OUTPATIENT_CLINIC_OR_DEPARTMENT_OTHER): Payer: Self-pay | Admitting: Physician Assistant

## 2023-11-29 ENCOUNTER — Ambulatory Visit (INDEPENDENT_AMBULATORY_CARE_PROVIDER_SITE_OTHER): Admitting: Physician Assistant

## 2023-11-29 DIAGNOSIS — S82891A Other fracture of right lower leg, initial encounter for closed fracture: Secondary | ICD-10-CM | POA: Diagnosis not present

## 2023-11-29 NOTE — Progress Notes (Signed)
 Office Visit Note   Patient: Krystal [name]           Date of Birth: 1950-05-13           MRN: [id number] Visit Date: 11/29/2023              Requested by: Shepard Ade, MD [address] [place],  [place] [address] PCP: Shepard Ade, MD   Assessment & Plan: Visit Diagnoses:  1. Closed fracture of right ankle, initial encounter     Plan: Patient is a pleasant 73 year old woman who comes in today 3 days status post injury to her right ankle.  She was seen elsewhere.  She was up at [facility].  X-rays on disc demonstrated a Weber type B displaced fibula fracture and concerns for widening of the clear space.  She did have a twisting injury to the ankle.  She was placed in a cam walker boot but is weightbearing.  She has quite a bit of swelling today and she is a diabetic.  I have advised that she really needs to be nonweightbearing.  Would like her to come in next week for reevaluation.  She is requesting to see Dr. Genelle as he was recommended to her.  I will see her on my schedule and consult with him as she he will be here the day she is in the clinic.  I explained to her that if this is an unstable fracture may recommend ORIF and at the very least would have her nonweightbearing.  She is going to get a knee scooter.  She cannot take aspirin  because of ulcers but I did inform her that she needs to be very wary of any pain in the calf of her right leg.  She does not have any history of blood clots.  Also have advised her to ice and elevate her leg is much as possible as she has significant swelling today  Follow-Up Instructions: Return in about 1 week (around 12/06/2023).   Orders:  No orders of the defined types were placed in this encounter.  No orders of the defined types were placed in this encounter.     Procedures: No procedures performed   Clinical Data: No additional findings.   Subjective: No chief complaint on file.   HPI patient is a pleasant  73 year old woman who was in [facility] this past weekend when she sustained a twisting injury to her right ankle.  She was seen and evaluated there she was told she had a fracture of the ankle and was immobilized in a cam walker boot she has been weightbearing using a cane.  She thinks she had a previous injury to this ankle many years ago.  Review of Systems  All other systems reviewed and are negative.    Objective: Vital Signs: There were no vitals taken for this visit.  Physical Exam Constitutional:      Appearance: Normal appearance.  Pulmonary:     Effort: Pulmonary effort is normal.  Neurological:     General: No focal deficit present.     Mental Status: She is alert and oriented to person, place, and time.  Psychiatric:        Mood and Affect: Mood normal.     Ortho Exam Examination of her right ankle her pulses are intact.  She has moderate plus soft tissue swelling especially over the lateral side of the ankle.  She does have a positive squeeze test no pain over  the proximal fibula.  More pain laterally than medially.  Compartments are soft and compressible.  No evidence of any cellulitis or skin breakdown Specialty Comments:  No specialty comments available.  Imaging: No results found.   PMFS History: Patient Active Problem List   Diagnosis Date Noted   Closed right ankle fracture 11/29/2023   Iron deficiency anemia 08/24/2021   S/P laparoscopic assisted vaginal hysterectomy (LAVH) 11/25/2015   History of breast cancer in female 10/25/2011   Sebaceous cyst 10/25/2011   Past Medical History:  Diagnosis Date   Anemia    Arthritis    right thumb - no meds   Breast cancer (HCC) 2005   left breast   Cataract    Diabetes mellitus without complication (HCC)    type 2   Hearing loss    bilateral - no hearing aids   Hyperlipidemia    Hypertension    Personal history of chemotherapy    Personal history of radiation therapy     Family History   Problem Relation Age of Onset   Heart disease Mother    Heart disease Father    Breast cancer Maternal Aunt     Past Surgical History:  Procedure Laterality Date   BREAST LUMPECTOMY Left 2005   BREAST SURGERY  2005   Left Br Lumpectomy   CESAREAN SECTION  2 times   chemo and radiation  2005-2006   for breast cancer/ 4 chemo treatments and 36 radiation treatments   COLONOSCOPY     x 2   CYSTOSCOPY N/A 11/25/2015   Procedure: CYSTOSCOPY;  Surgeon: Gigi Botts, MD;  Location: [facility];  Service: Gynecology;  Laterality: N/A;   LAPAROSCOPIC VAGINAL HYSTERECTOMY WITH SALPINGO OOPHORECTOMY Bilateral 11/25/2015   Procedure: LAPAROSCOPIC ASSISTED VAGINAL HYSTERECTOMY WITH BILATERAL SALPINGO OOPHORECTOMY, LAPARSCOPIC CYSTOTOMY REPAIR WITH BILATERAL RETROGRADES;  Surgeon: Gigi Botts, MD;  Location: [facility];  Service: Gynecology;  Laterality: Bilateral;   NASAL SINUS SURGERY     TONSILLECTOMY     TUBAL LIGATION     WISDOM TOOTH EXTRACTION     Social History   Occupational History   Not on file  Tobacco Use   Smoking status: Never   Smokeless tobacco: Never  Substance and Sexual Activity   Alcohol use: No   Drug use: No   Sexual activity: Yes    Birth control/protection: Post-menopausal

## 2023-11-30 ENCOUNTER — Encounter: Payer: Self-pay | Admitting: Nurse Practitioner

## 2023-11-30 ENCOUNTER — Other Ambulatory Visit (HOSPITAL_BASED_OUTPATIENT_CLINIC_OR_DEPARTMENT_OTHER): Payer: Self-pay

## 2023-11-30 ENCOUNTER — Encounter (HOSPITAL_BASED_OUTPATIENT_CLINIC_OR_DEPARTMENT_OTHER): Payer: Self-pay | Admitting: Student

## 2023-11-30 ENCOUNTER — Ambulatory Visit (HOSPITAL_BASED_OUTPATIENT_CLINIC_OR_DEPARTMENT_OTHER): Admitting: Physician Assistant

## 2023-11-30 ENCOUNTER — Ambulatory Visit (HOSPITAL_BASED_OUTPATIENT_CLINIC_OR_DEPARTMENT_OTHER): Admitting: Student

## 2023-11-30 DIAGNOSIS — S82891A Other fracture of right lower leg, initial encounter for closed fracture: Secondary | ICD-10-CM | POA: Diagnosis not present

## 2023-11-30 MED ORDER — ASPIRIN 325 MG PO TBEC
325.0000 mg | DELAYED_RELEASE_TABLET | Freq: Every day | ORAL | 0 refills | Status: AC
  Filled 2023-11-30: qty 14, 14d supply, fill #0

## 2023-11-30 MED ORDER — OXYCODONE HCL 5 MG PO TABS
5.0000 mg | ORAL_TABLET | ORAL | 0 refills | Status: DC | PRN
  Filled 2023-11-30: qty 20, 4d supply, fill #0

## 2023-11-30 MED ORDER — ACETAMINOPHEN 500 MG PO TABS
500.0000 mg | ORAL_TABLET | Freq: Four times a day (QID) | ORAL | 0 refills | Status: DC | PRN
  Filled 2023-11-30: qty 30, 8d supply, fill #0

## 2023-11-30 NOTE — Progress Notes (Signed)
 Chief Complaint: Right ankle fracture     History of Present Illness:    Krystal [name] is a 73 y.o. female who presents today for follow-up and treatment discussion of a right ankle fracture.  She sustained this due to an inversion injury while up near [facility] this past weekend.  She was seen in that area initially and x-rays demonstrated a distal fibula fracture with some medial joint space widening.  She was seen in our clinic yesterday and was made nonweightbearing for which she has been utilizing a knee scooter.   Surgical History:   None  PMH/PSH/Family History/Social History/Meds/Allergies:    Past Medical History:  Diagnosis Date   Anemia    Arthritis    right thumb - no meds   Breast cancer (HCC) 2005   left breast   Cataract    Diabetes mellitus without complication (HCC)    type 2   Hearing loss    bilateral - no hearing aids   Hyperlipidemia    Hypertension    Personal history of chemotherapy    Personal history of radiation therapy    Past Surgical History:  Procedure Laterality Date   BREAST LUMPECTOMY Left 2005   BREAST SURGERY  2005   Left Br Lumpectomy   CESAREAN SECTION  2 times   chemo and radiation  2005-2006   for breast cancer/ 4 chemo treatments and 36 radiation treatments   COLONOSCOPY     x 2   CYSTOSCOPY N/A 11/25/2015   Procedure: CYSTOSCOPY;  Surgeon: Gigi Botts, MD;  Location: [facility];  Service: Gynecology;  Laterality: N/A;   LAPAROSCOPIC VAGINAL HYSTERECTOMY WITH SALPINGO OOPHORECTOMY Bilateral 11/25/2015   Procedure: LAPAROSCOPIC ASSISTED VAGINAL HYSTERECTOMY WITH BILATERAL SALPINGO OOPHORECTOMY, LAPARSCOPIC CYSTOTOMY REPAIR WITH BILATERAL RETROGRADES;  Surgeon: Gigi Botts, MD;  Location: [facility];  Service: Gynecology;  Laterality: Bilateral;   NASAL SINUS SURGERY     TONSILLECTOMY     TUBAL LIGATION     WISDOM TOOTH EXTRACTION     Social History   Socioeconomic History   Marital  status: Married    Spouse name: Not on file   Number of children: Not on file   Years of education: Not on file   Highest education level: Not on file  Occupational History   Not on file  Tobacco Use   Smoking status: Never   Smokeless tobacco: Never  Substance and Sexual Activity   Alcohol use: No   Drug use: No   Sexual activity: Yes    Birth control/protection: Post-menopausal  Other Topics Concern   Not on file  Social History Narrative   Not on file   Social Drivers of Health   Financial Resource Strain: Not on file  Food Insecurity: Not on file  Transportation Needs: Not on file  Physical Activity: Not on file  Stress: Not on file  Social Connections: Not on file   Family History  Problem Relation Age of Onset   Heart disease Mother    Heart disease Father    Breast cancer Maternal Aunt    No Known Allergies Current Outpatient Medications  Medication Sig Dispense Refill   acetaminophen  (TYLENOL ) 500 MG tablet Take 1 tablet (500 mg total) by mouth every 6 (six) hours as needed for up to 14 days. 30 tablet  0   aspirin  EC 325 MG tablet Take 1 tablet (325 mg total) by mouth daily. 14 tablet 0   oxyCODONE  (ROXICODONE ) 5 MG immediate release tablet Take 1 tablet (5 mg total) by mouth every 4 (four) hours as needed for severe pain (pain score 7-10) or breakthrough pain. 20 tablet 0   calcium carbonate (TUMS EX) 750 MG chewable tablet Chew 1 tablet by mouth as needed for heartburn.     ferrous sulfate  325 (65 FE) MG EC tablet TAKE 1 TABLET BY MOUTH TWO TIMES A DAY BEFORE A MEAL 60 tablet 3   Insulin  Human (INSULIN  PUMP) SOLN Inject 1 each into the skin continuous. V-Go Disposable Insulin  Delivery Device administers 78 units per 24 hours of insulin  lispro (Humalog).     Lancets (ONETOUCH ULTRASOFT) lancets      metFORMIN  (GLUCOPHAGE ) 500 MG tablet Take 500 mg by mouth daily with breakfast.     ONE TOUCH ULTRA TEST test strip      pravastatin (PRAVACHOL) 40 MG tablet Take  40 mg by mouth Nightly.     valsartan-hydrochlorothiazide (DIOVAN-HCT) 160-25 MG per tablet Take 1 tablet by mouth Daily.     No current facility-administered medications for this visit.   Facility-Administered Medications Ordered in Other Visits  Medication Dose Route Frequency Provider Last Rate Last Admin   iopamidol  (ISOVUE -300) 61 % injection 90 mL  90 mL Other Once PRN Pinn, Walda, MD       iopamidol  (ISOVUE -300) 61 % injection 90 mL  90 mL Intravenous Once PRN Pinn, Walda, MD       No results found.  Review of Systems:   A ROS was performed including pertinent positives and negatives as documented in the HPI.  Physical Exam :   Constitutional: NAD and appears stated age Neurological: Alert and oriented Psych: Appropriate affect and cooperative There were no vitals taken for this visit.   Comprehensive Musculoskeletal Exam:    Patient is immobilized in a walking boot and is nonweightbearing with the use of a knee scooter.  Imaging:   Xray review from 7/19 (right ankle 4 views): Oblique fracture of the distal fibula with medial joint space widening   I personally reviewed and interpreted the radiographs.   Assessment:   73 y.o. female with a right distal fibula fracture sustained this past weekend due to an inversion injury.  Review of x-rays does also demonstrate some widening of the medial joint space, suggesting underlying ankle instability.  Given this, we did discuss treatment options including nonoperative versus operative management.  I do think she would do extremely well with surgical fixation in order to reestablish stability within the ankle and allow for early weightbearing as tolerated.  After a detailed discussion covering diagnosis and treatment options--including the risks, benefits, alternatives, and potential complications of surgical and nonsurgical management--the patient elected to proceed with surgery.  Would recommend that she remain nonweightbearing  until surgery.  Postop meds sent to the pharmacy.  Plan :    - Plan for right ankle ORIF with Dr. Genelle     I personally saw and evaluated the patient, and participated in the management and treatment plan.  Leonce Reveal, PA-C Orthopedics

## 2023-12-05 ENCOUNTER — Other Ambulatory Visit: Payer: Self-pay

## 2023-12-05 ENCOUNTER — Ambulatory Visit: Payer: Self-pay | Admitting: Orthopaedic Surgery

## 2023-12-05 ENCOUNTER — Other Ambulatory Visit (HOSPITAL_BASED_OUTPATIENT_CLINIC_OR_DEPARTMENT_OTHER): Payer: Self-pay | Admitting: Orthopaedic Surgery

## 2023-12-05 ENCOUNTER — Encounter (HOSPITAL_COMMUNITY): Payer: Self-pay | Admitting: Orthopaedic Surgery

## 2023-12-05 DIAGNOSIS — S82891A Other fracture of right lower leg, initial encounter for closed fracture: Secondary | ICD-10-CM

## 2023-12-05 NOTE — Progress Notes (Signed)
 SDW CALL  Patient was given pre-op instructions over the phone. The opportunity was given for the patient to ask questions. No further questions asked. Patient verbalized understanding of instructions given.   PCP - Charlie Love Cardiologist - denies  PPM/ICD - denies   Chest x-ray - denies EKG - DOS Stress Test- denies  ECHO - denies Cardiac Cath - denies  Sleep Study - denies   Fasting Blood Sugar - 85 Checks Blood Sugar __4___ times a day  Patient wears a V-go insulin  pump with 78 units of Humalog every 24 hours.  Patient instructed to call Dr. Gaston office for further instructions.  Patient is not able to reduce the basal rate by 20%.  Patient states that she is insulin  dependent.  Educated patient to check blood sugar every 2 hours on the day of surgery and if she were to have a reading less than 70, to drink apple juice or cranberry juice and recheck in 15 minutes.    Last dose of GLP1 agonist-  n/a GLP1 instructions:  n/a  Blood Thinner Instructions: n/a Aspirin  Instructions: patient states that she has not been taking Aspirin   ERAS Protcol - clears until 1345 PRE-SURGERY Ensure or G2-  n/a  COVID TEST- no   Anesthesia review:  no  Patient denies shortness of breath, fever, cough and chest pain over the phone call   All instructions explained to the patient, with a verbal understanding of the material. Patient agrees to go over the instructions while at home for a better understanding.

## 2023-12-06 ENCOUNTER — Encounter (HOSPITAL_COMMUNITY)
Admission: RE | Disposition: A | Discharge status: Home / Self Care | Payer: Self-pay | Source: Home / Self Care | Attending: Orthopaedic Surgery

## 2023-12-06 ENCOUNTER — Ambulatory Visit (HOSPITAL_COMMUNITY): Admitting: Anesthesiology

## 2023-12-06 ENCOUNTER — Other Ambulatory Visit: Payer: Self-pay

## 2023-12-06 ENCOUNTER — Ambulatory Visit (HOSPITAL_COMMUNITY)

## 2023-12-06 ENCOUNTER — Encounter (HOSPITAL_COMMUNITY): Payer: Self-pay | Admitting: Orthopaedic Surgery

## 2023-12-06 ENCOUNTER — Ambulatory Visit (HOSPITAL_COMMUNITY)
Admission: RE | Admit: 2023-12-06 | Discharge: 2023-12-06 | Disposition: A | Discharge status: Home / Self Care | Attending: Orthopaedic Surgery | Admitting: Orthopaedic Surgery

## 2023-12-06 DIAGNOSIS — Z7984 Long term (current) use of oral hypoglycemic drugs: Secondary | ICD-10-CM | POA: Diagnosis not present

## 2023-12-06 DIAGNOSIS — X58XXXA Exposure to other specified factors, initial encounter: Secondary | ICD-10-CM | POA: Insufficient documentation

## 2023-12-06 DIAGNOSIS — K219 Gastro-esophageal reflux disease without esophagitis: Secondary | ICD-10-CM | POA: Diagnosis not present

## 2023-12-06 DIAGNOSIS — Z794 Long term (current) use of insulin: Secondary | ICD-10-CM | POA: Insufficient documentation

## 2023-12-06 DIAGNOSIS — S8261XA Displaced fracture of lateral malleolus of right fibula, initial encounter for closed fracture: Secondary | ICD-10-CM | POA: Diagnosis not present

## 2023-12-06 DIAGNOSIS — Z79899 Other long term (current) drug therapy: Secondary | ICD-10-CM | POA: Insufficient documentation

## 2023-12-06 DIAGNOSIS — I10 Essential (primary) hypertension: Secondary | ICD-10-CM | POA: Insufficient documentation

## 2023-12-06 DIAGNOSIS — E119 Type 2 diabetes mellitus without complications: Secondary | ICD-10-CM | POA: Diagnosis not present

## 2023-12-06 DIAGNOSIS — S82891A Other fracture of right lower leg, initial encounter for closed fracture: Secondary | ICD-10-CM

## 2023-12-06 HISTORY — PX: ORIF ANKLE FRACTURE: SHX5408

## 2023-12-06 HISTORY — DX: Gastro-esophageal reflux disease without esophagitis: K21.9

## 2023-12-06 LAB — GLUCOSE, CAPILLARY
Glucose-Capillary: 89 mg/dL (ref 70–99)
Glucose-Capillary: 131 mg/dL — ABNORMAL HIGH (ref 70–99)
Glucose-Capillary: 73 mg/dL (ref 70–99)
Glucose-Capillary: 83 mg/dL (ref 70–99)
Glucose-Capillary: 102 mg/dL — ABNORMAL HIGH (ref 70–99)

## 2023-12-06 LAB — BASIC METABOLIC PANEL WITH GFR
Anion gap: 13 (ref 5–15)
BUN: 9 mg/dL (ref 8–23)
CO2: 24 mmol/L (ref 22–32)
Calcium: 9.1 mg/dL (ref 8.9–10.3)
Chloride: 103 mmol/L (ref 98–111)
Creatinine, Ser: 0.68 mg/dL (ref 0.44–1.00)
GFR, Estimated: >60 mL/min (ref >60)
Glucose, Bld: 71 mg/dL (ref 70–99)
Potassium: 3.2 mmol/L — ABNORMAL LOW (ref 3.5–5.1)
Sodium: 140 mmol/L (ref 135–145)

## 2023-12-06 LAB — CBC
HCT: 40.9 % (ref 36.0–46.0)
Hemoglobin: 12.8 g/dL (ref 12.0–15.0)
MCH: 30.3 pg (ref 26.0–34.0)
MCHC: 31.3 g/dL (ref 30.0–36.0)
MCV: 96.7 fL (ref 80.0–100.0)
Platelets: 411 K/uL — ABNORMAL HIGH (ref 150–400)
RBC: 4.23 MIL/uL (ref 3.87–5.11)
RDW: 14.5 % (ref 11.5–15.5)
WBC: 6.2 K/uL (ref 4.0–10.5)
nRBC: 0 % (ref 0.0–0.2)

## 2023-12-06 LAB — SURGICAL PCR SCREEN
MRSA, PCR: NEGATIVE
Staphylococcus aureus: NEGATIVE

## 2023-12-06 SURGERY — OPEN REDUCTION INTERNAL FIXATION (ORIF) ANKLE FRACTURE
Anesthesia: General | Site: Ankle | Laterality: Right

## 2023-12-06 MED ORDER — LIDOCAINE 2% (20 MG/ML) 5 ML SYRINGE
INTRAMUSCULAR | Status: DC | PRN
  Administered 2023-12-06: 60 mg via INTRAVENOUS

## 2023-12-06 MED ORDER — LACTATED RINGERS IV SOLN: INTRAVENOUS | Status: DC

## 2023-12-06 MED ORDER — HYDROMORPHONE HCL 1 MG/ML IJ SOLN
INTRAMUSCULAR | Status: AC
  Filled 2023-12-06: qty 0.5

## 2023-12-06 MED ORDER — FENTANYL CITRATE (PF) 100 MCG/2ML IJ SOLN: 50.0000 ug | Freq: Once | INTRAMUSCULAR | Status: AC

## 2023-12-06 MED ORDER — DEXTROSE 50 % IV SOLN
INTRAVENOUS | Status: AC
  Administered 2023-12-06: 25 mL via INTRAVENOUS
  Filled 2023-12-06: qty 50

## 2023-12-06 MED ORDER — FENTANYL CITRATE (PF) 100 MCG/2ML IJ SOLN
INTRAMUSCULAR | Status: AC
  Administered 2023-12-06: 50 ug via INTRAVENOUS
  Filled 2023-12-06: qty 2

## 2023-12-06 MED ORDER — BUPIVACAINE-EPINEPHRINE (PF) 0.5% -1:200000 IJ SOLN
INTRAMUSCULAR | Status: DC | PRN
  Administered 2023-12-06: 30 mL via PERINEURAL

## 2023-12-06 MED ORDER — 0.9 % SODIUM CHLORIDE (POUR BTL) OPTIME
TOPICAL | Status: DC | PRN
  Administered 2023-12-06: 1000 mL

## 2023-12-06 MED ORDER — ACETAMINOPHEN 500 MG PO TABS
1000.0000 mg | ORAL_TABLET | Freq: Once | ORAL | Status: AC
  Administered 2023-12-06: 1000 mg via ORAL
  Filled 2023-12-06: qty 2

## 2023-12-06 MED ORDER — PROPOFOL 10 MG/ML IV BOLUS
INTRAVENOUS | Status: DC | PRN
  Administered 2023-12-06: 150 mg via INTRAVENOUS

## 2023-12-06 MED ORDER — GABAPENTIN 300 MG PO CAPS
300.0000 mg | ORAL_CAPSULE | Freq: Once | ORAL | Status: AC
  Administered 2023-12-06: 300 mg via ORAL
  Filled 2023-12-06: qty 1

## 2023-12-06 MED ORDER — DEXAMETHASONE SODIUM PHOSPHATE 10 MG/ML IJ SOLN
INTRAMUSCULAR | Status: DC | PRN
  Administered 2023-12-06: 4 mg via INTRAVENOUS

## 2023-12-06 MED ORDER — MIDAZOLAM HCL 2 MG/2ML IJ SOLN
INTRAMUSCULAR | Status: DC
Start: 2023-12-06 — End: 2023-12-06
  Filled 2023-12-06: qty 2

## 2023-12-06 MED ORDER — DEXTROSE 50 % IV SOLN: 25.0000 mL | Freq: Once | INTRAVENOUS | Status: AC

## 2023-12-06 MED ORDER — ONDANSETRON HCL 4 MG/2ML IJ SOLN
INTRAMUSCULAR | Status: DC | PRN
  Administered 2023-12-06: 4 mg via INTRAVENOUS

## 2023-12-06 MED ORDER — CHLORHEXIDINE GLUCONATE 0.12 % MT SOLN
OROMUCOSAL | Status: AC
  Administered 2023-12-06: 15 mL via OROMUCOSAL
  Filled 2023-12-06: qty 15

## 2023-12-06 MED ORDER — ACETAMINOPHEN 500 MG PO TABS: 1000.0000 mg | ORAL_TABLET | Freq: Once | ORAL | Status: DC

## 2023-12-06 MED ORDER — ORAL CARE MOUTH RINSE: 15.0000 mL | Freq: Once | OROMUCOSAL | Status: AC

## 2023-12-06 MED ORDER — CHLORHEXIDINE GLUCONATE 0.12 % MT SOLN: 15.0000 mL | Freq: Once | OROMUCOSAL | Status: AC

## 2023-12-06 MED ORDER — HYDROMORPHONE HCL 1 MG/ML IJ SOLN
INTRAMUSCULAR | Status: DC | PRN
  Administered 2023-12-06: .5 mg via INTRAVENOUS

## 2023-12-06 MED ORDER — TRANEXAMIC ACID-NACL 1000-0.7 MG/100ML-% IV SOLN
1000.0000 mg | INTRAVENOUS | Status: AC
  Administered 2023-12-06: 1000 mg via INTRAVENOUS
  Filled 2023-12-06: qty 100

## 2023-12-06 MED ORDER — CEFAZOLIN SODIUM-DEXTROSE 2-4 GM/100ML-% IV SOLN
2.0000 g | INTRAVENOUS | Status: AC
  Administered 2023-12-06: 2 g via INTRAVENOUS
  Filled 2023-12-06: qty 100

## 2023-12-06 SURGICAL SUPPLY — 41 items
BAG COUNTER SPONGE SURGICOUNT (BAG) ×1 IMPLANT
BIT DRILL 2.4X180 SLD AO (BIT) IMPLANT
BNDG COHESIVE 4X5 TAN STRL LF (GAUZE/BANDAGES/DRESSINGS) ×1 IMPLANT
BNDG COHESIVE 6X5 TAN ST LF (GAUZE/BANDAGES/DRESSINGS) ×1 IMPLANT
BNDG ELASTIC 4INX 5YD STR LF (GAUZE/BANDAGES/DRESSINGS) IMPLANT
BNDG GAUZE DERMACEA FLUFF 4 (GAUZE/BANDAGES/DRESSINGS) ×1 IMPLANT
COVER SURGICAL LIGHT HANDLE (MISCELLANEOUS) ×1 IMPLANT
DRAPE C-ARM 42X120 X-RAY (DRAPES) ×1 IMPLANT
DRAPE C-ARMOR (DRAPES) IMPLANT
DRAPE U-SHAPE 47X51 STRL (DRAPES) ×1 IMPLANT
DRSG ADAPTIC 3X8 NADH LF (GAUZE/BANDAGES/DRESSINGS) ×1 IMPLANT
DURAPREP 26ML APPLICATOR (WOUND CARE) ×1 IMPLANT
ELECTRODE REM PT RTRN 9FT ADLT (ELECTROSURGICAL) ×1 IMPLANT
GAUZE PAD ABD 8X10 STRL (GAUZE/BANDAGES/DRESSINGS) ×1 IMPLANT
GAUZE SPONGE 4X4 12PLY STRL (GAUZE/BANDAGES/DRESSINGS) ×1 IMPLANT
GAUZE XEROFORM 1X8 LF (GAUZE/BANDAGES/DRESSINGS) IMPLANT
GLOVE BIOGEL PI IND STRL 6.5 (GLOVE) ×1 IMPLANT
GLOVE BIOGEL PI IND STRL 8 (GLOVE) ×1 IMPLANT
GLOVE ECLIPSE 6.0 STRL STRAW (GLOVE) ×1 IMPLANT
GLOVE INDICATOR 8.0 STRL GRN (GLOVE) ×1 IMPLANT
GOWN STRL REUS W/ TWL LRG LVL3 (GOWN DISPOSABLE) ×1 IMPLANT
GOWN STRL REUS W/ TWL XL LVL3 (GOWN DISPOSABLE) ×3 IMPLANT
KIT BASIN OR (CUSTOM PROCEDURE TRAY) ×1 IMPLANT
KIT TURNOVER KIT B (KITS) ×1 IMPLANT
MANIFOLD NEPTUNE II (INSTRUMENTS) ×1 IMPLANT
NS IRRIG 1000ML POUR BTL (IV SOLUTION) ×1 IMPLANT
PACK ORTHO EXTREMITY (CUSTOM PROCEDURE TRAY) ×1 IMPLANT
PAD ARMBOARD POSITIONER FOAM (MISCELLANEOUS) ×2 IMPLANT
PADDING CAST ABS COTTON 4X4 ST (CAST SUPPLIES) IMPLANT
PLATE FIB STRT 7H (Plate) IMPLANT
SCREW 3.5X16 NONLOCKING (Screw) IMPLANT
SCREW NLOCK PLATE RECON 3.5X10 (Screw) IMPLANT
SCREW NON LOCKING 3.5X12 (Screw) IMPLANT
SCREW NON LOCKING 3.5X14 (Screw) IMPLANT
SUCTION TUBE FRAZIER 10FR DISP (SUCTIONS) ×1 IMPLANT
SUT ETHILON 3 0 FSL (SUTURE) ×2 IMPLANT
SUT VIC AB 0 CT1 36 (SUTURE) IMPLANT
SUT VIC AB 2-0 CT1 TAPERPNT 27 (SUTURE) ×1 IMPLANT
TOWEL GREEN STERILE (TOWEL DISPOSABLE) ×1 IMPLANT
TOWEL GREEN STERILE FF (TOWEL DISPOSABLE) ×1 IMPLANT
TUBE CONNECTING 12X1/4 (SUCTIONS) ×1 IMPLANT

## 2023-12-06 NOTE — Transfer of Care (Signed)
 Immediate Anesthesia Transfer of Care Note  Patient: Krystal [name]  Procedure(s) Performed: OPEN REDUCTION INTERNAL FIXATION (ORIF) ANKLE FRACTURE (Right: Ankle)  Patient Location: PACU  Anesthesia Type:General and Regional  Level of Consciousness: awake, oriented, and drowsy  Airway & Oxygen Therapy: Patient connected to nasal cannula oxygen  Post-op Assessment: Report given to RN and Post -op Vital signs reviewed and stable  Post vital signs: Reviewed and stable  Last Vitals:  Vitals Value Taken Time  BP 136/59 12/06/23 17:42  Temp    Pulse 85 12/06/23 17:45  Resp 12 12/06/23 17:45  SpO2 97 % 12/06/23 17:45  Vitals shown include unfiled device data.  Last Pain:  Vitals:   12/06/23 1615  TempSrc:   PainSc: 3          Complications: No notable events documented.

## 2023-12-06 NOTE — Op Note (Signed)
   Date of Surgery: 12/06/2023  INDICATIONS: Ms. Lecy is a 73 y.o.-year-old female with right displaced fibula fracture.  The risk and benefits of the procedure were discussed in detail and documented in the pre-operative evaluation.   PREOPERATIVE DIAGNOSIS: 1.  Right displaced distal fibula fracture  POSTOPERATIVE DIAGNOSIS: Same.  PROCEDURE: 1.  Open reduction internal fixation right distal fibula  SURGEON: Elspeth LITTIE Parker MD  ASSISTANT: Conley Dawson, ATC  ANESTHESIA:  general  IV FLUIDS AND URINE: See anesthesia record.  ANTIBIOTICS: Ancef   ESTIMATED BLOOD LOSS: 10 mL.  IMPLANTS:  * No implants in log *  DRAINS: None  CULTURES: None  COMPLICATIONS: none  DESCRIPTION OF PROCEDURE:  I identified the patient in the pre-operative holding area.  I marked the operative right with my initials. I reviewed the risks and benefits of the proposed surgical intervention and the patient (and/or patient's guardian) wished to proceed.  Anesthesia was then performed with a regional block.  The patient was transferred to the operative suite and placed in the supine position with all bony prominences padded.     SCDs were placed on the non-operative lower extremity. Appropriate antibiotics was administered within 1 hour before incision. The operative extremity was then prepped and draped in standard fashion. A time out was performed confirming the correct extremity, correct patient and correct procedure.    A lateral incision over the fibula was made centered around the fracture. The incision was taken sharply down to bone. Full thickness anterior and posterior flaps were made. The fracture site was cleared of debris. Reduction clamps were used to mobilize the fracture ends and hold the fracture in place. Fluoroscopy was used to confirm proper alignment.  The plate was secured with 3.27mm screws proximally and 3 screws distally. Fluoroscopy was used to confirm proper placement of screws.   AP and lateral fluoroscopy confirmed anatomic reduction.  Stress radiographs confirmed equal medial mortise widening.  The wounds were thoroughly irrigated. The incisions were closed with vertical mattress 3-0 nylons. The incisions were dressed with xeroform, 4x4s, webril, and bias. A boot was placed.     POSTOPERATIVE PLAN: She will be weightbearing as tolerated in her cam boot.  She be placed on aspirin  for 2 weeks for block attention.  I will see her back in the office postop.  She will begin physical therapy immediately postop  Elspeth LITTIE Parker, MD 5:18 PM

## 2023-12-06 NOTE — H&P (Signed)
 Chief Complaint: Right ankle fracture        History of Present Illness:      Krystal [name] is a 73 y.o. female who presents today for follow-up and treatment discussion of a right ankle fracture.  She sustained this due to an inversion injury while up near [facility] this past weekend.  She was seen in that area initially and x-rays demonstrated a distal fibula fracture with some medial joint space widening.  She was seen in our clinic yesterday and was made nonweightbearing for which she has been utilizing a knee scooter.     Surgical History:   None   PMH/PSH/Family History/Social History/Meds/Allergies:         Past Medical History:  Diagnosis Date   Anemia     Arthritis      right thumb - no meds   Breast cancer (HCC) 2005    left breast   Cataract     Diabetes mellitus without complication (HCC)      type 2   Hearing loss      bilateral - no hearing aids   Hyperlipidemia     Hypertension     Personal history of chemotherapy     Personal history of radiation therapy               Past Surgical History:  Procedure Laterality Date   BREAST LUMPECTOMY Left 2005   BREAST SURGERY   2005    Left Br Lumpectomy   CESAREAN SECTION   2 times   chemo and radiation   2005-2006    for breast cancer/ 4 chemo treatments and 36 radiation treatments   COLONOSCOPY        x 2   CYSTOSCOPY N/A 11/25/2015    Procedure: CYSTOSCOPY;  Surgeon: Gigi Botts, MD;  Location: [facility];  Service: Gynecology;  Laterality: N/A;   LAPAROSCOPIC VAGINAL HYSTERECTOMY WITH SALPINGO OOPHORECTOMY Bilateral 11/25/2015    Procedure: LAPAROSCOPIC ASSISTED VAGINAL HYSTERECTOMY WITH BILATERAL SALPINGO OOPHORECTOMY, LAPARSCOPIC CYSTOTOMY REPAIR WITH BILATERAL RETROGRADES;  Surgeon: Gigi Botts, MD;  Location: [facility];  Service: Gynecology;  Laterality: Bilateral;   NASAL SINUS SURGERY       TONSILLECTOMY       TUBAL LIGATION       WISDOM TOOTH EXTRACTION             Social History         Socioeconomic History   Marital status: Married      Spouse name: Not on file   Number of children: Not on file   Years of education: Not on file   Highest education level: Not on file  Occupational History   Not on file  Tobacco Use   Smoking status: Never   Smokeless tobacco: Never  Substance and Sexual Activity   Alcohol use: No   Drug use: No   Sexual activity: Yes      Birth control/protection: Post-menopausal  Other Topics Concern   Not on file  Social History Narrative   Not on file    Social Drivers of Health    Financial Resource Strain: Not on file  Food Insecurity: Not on file  Transportation Needs: Not on file  Physical Activity: Not on file  Stress: Not on file  Social Connections: Not on file         Family History  Problem Relation Age of Onset   Heart disease Mother     Heart  disease Father     Breast cancer Maternal Aunt          Allergies  No Known Allergies         Current Outpatient Medications  Medication Sig Dispense Refill   acetaminophen  (TYLENOL ) 500 MG tablet Take 1 tablet (500 mg total) by mouth every 6 (six) hours as needed for up to 14 days. 30 tablet 0   aspirin  EC 325 MG tablet Take 1 tablet (325 mg total) by mouth daily. 14 tablet 0   oxyCODONE  (ROXICODONE ) 5 MG immediate release tablet Take 1 tablet (5 mg total) by mouth every 4 (four) hours as needed for severe pain (pain score 7-10) or breakthrough pain. 20 tablet 0   calcium carbonate (TUMS EX) 750 MG chewable tablet Chew 1 tablet by mouth as needed for heartburn.       ferrous sulfate  325 (65 FE) MG EC tablet TAKE 1 TABLET BY MOUTH TWO TIMES A DAY BEFORE A MEAL 60 tablet 3   Insulin  Human (INSULIN  PUMP) SOLN Inject 1 each into the skin continuous. V-Go Disposable Insulin  Delivery Device administers 78 units per 24 hours of insulin  lispro (Humalog).       Lancets (ONETOUCH ULTRASOFT) lancets         metFORMIN  (GLUCOPHAGE ) 500 MG tablet Take 500 mg by  mouth daily with breakfast.       ONE TOUCH ULTRA TEST test strip         pravastatin (PRAVACHOL) 40 MG tablet Take 40 mg by mouth Nightly.       valsartan-hydrochlorothiazide (DIOVAN-HCT) 160-25 MG per tablet Take 1 tablet by mouth Daily.          No current facility-administered medications for this visit.             Facility-Administered Medications Ordered in Other Visits  Medication Dose Route Frequency Provider Last Rate Last Admin   iopamidol  (ISOVUE -300) 61 % injection 90 mL  90 mL Other Once PRN Pinn, Walda, MD       iopamidol  (ISOVUE -300) 61 % injection 90 mL  90 mL Intravenous Once PRN Pinn, Walda, MD          Imaging Results (Last 48 hours)  No results found.     Review of Systems:   A ROS was performed including pertinent positives and negatives as documented in the HPI.   Physical Exam :   Constitutional: NAD and appears stated age Neurological: Alert and oriented Psych: Appropriate affect and cooperative There were no vitals taken for this visit.    Comprehensive Musculoskeletal Exam:     Patient is immobilized in a walking boot and is nonweightbearing with the use of a knee scooter.   Imaging:   Xray review from 7/19 (right ankle 4 views): Oblique fracture of the distal fibula with medial joint space widening     I personally reviewed and interpreted the radiographs.     Assessment:   73 y.o. female with a right distal fibula fracture sustained this past weekend due to an inversion injury.  Review of x-rays does also demonstrate some widening of the medial joint space, suggesting underlying ankle instability.  Given this, we did discuss treatment options including nonoperative versus operative management.  I do think she would do extremely well with surgical fixation in order to reestablish stability within the ankle and allow for early weightbearing as tolerated.  After a detailed discussion covering diagnosis and treatment options--including the risks,  benefits, alternatives, and potential complications of  surgical and nonsurgical management--the patient elected to proceed with surgery.  Would recommend that she remain nonweightbearing until surgery.  Postop meds sent to the pharmacy.   Plan :     - Plan for right ankle ORIF with Dr. Genelle         I personally saw and evaluated the patient, and participated in the management and treatment plan.

## 2023-12-06 NOTE — Discharge Instructions (Signed)
 Discharge Instructions    Attending Surgeon: Elspeth Parker, MD Office Phone Number: [phone]   Diagnosis and Procedures:    Surgeries Performed: Right ankle open reduction internal fixation  Discharge Plan:    Diet: Resume usual diet. Begin with light or bland foods.  Drink plenty of fluids.  Activity:  Weightbearing as tolerated right leg. You are advised to go home directly from the hospital or surgical center. Restrict your activities.  GENERAL INSTRUCTIONS: 1.  Please apply ice to your wound to help with swelling and inflammation. This will improve your comfort and your overall recovery following surgery.     2. Please call Dr. Danetta office at [phone] with questions Monday-Friday during business hours. If no one answers, please leave a message and someone should get back to the patient within 24 hours. For emergencies please call 911 or proceed to the emergency room.   3. Patient to notify surgical team if experiences any of the following: Bowel/Bladder dysfunction, uncontrolled pain, nerve/muscle weakness, incision with increased drainage or redness, nausea/vomiting and Fever greater than 101.0 F.  Be alert for signs of infection including redness, streaking, odor, fever or chills. Be alert for excessive pain or bleeding and notify your surgeon immediately.  WOUND INSTRUCTIONS:   Leave your dressing, cast, or splint in place until your post operative visit.  Keep it clean and dry.  Always keep the incision clean and dry until the staples/sutures are removed. If there is no drainage from the incision you should keep it open to air. If there is drainage from the incision you must keep it covered at all times until the drainage stops  Do not soak in a bath tub, hot tub, pool, lake or other body of water  until 21 days after your surgery and your incision is completely dry and healed.  If you have removable sutures (or staples) they must be removed 10-14 days  (unless otherwise instructed) from the day of your surgery.     1)  Elevate the extremity as much as possible.  2)  Keep the dressing clean and dry.  3)  Please call us  if the dressing becomes wet or dirty.  4)  If you are experiencing worsening pain or worsening swelling, please call.     MEDICATIONS: Resume all previous home medications at the previous prescribed dose and frequency unless otherwise noted Start taking the  pain medications on an as-needed basis as prescribed  Please taper down pain medication over the next week following surgery.  Ideally you should not require a refill of any narcotic pain medication.  Take pain medication with food to minimize nausea. In addition to the prescribed pain medication, you may take over-the-counter pain relievers such as Tylenol .  Do NOT take additional tylenol  if your pain medication already has tylenol  in it.  Aspirin  325mg  daily per instructions on bottle. Narcotic policy: Per [facility] clinic policy, our goal is ensure optimal postoperative pain control with a multimodal pain management strategy. For all [facility] patients, our goal is to wean post-operative narcotic medications by 6 weeks post-operatively, and many times sooner. If this is not possible due to utilization of pain medication prior to surgery, your [facility] doctor will support your acute post-operative pain control for the first 6 weeks postoperatively, with a plan to transition you back to your primary pain team following that. Maralee will work to ensure a Therapist, occupational.       FOLLOWUP INSTRUCTIONS: 1. Follow up at the Physical  Therapy Clinic 3-4 days following surgery. This appointment should be scheduled unless other arrangements have been made.The Physical Therapy scheduling number is [phone] if an appointment has not already been arranged.  2. Contact Dr. Danetta office during office hours at [phone] or the practice after hours line at [phone] for  non-emergencies. For medical emergencies call 911.   Discharge Location: Home

## 2023-12-06 NOTE — Brief Op Note (Signed)
   Brief Op Note  Date of Surgery: 12/06/2023  Preoperative Diagnosis: RIGHT LATERAL MALLEOLUS FRACTURE  Postoperative Diagnosis: same  Procedure: Procedure(s): OPEN REDUCTION INTERNAL FIXATION (ORIF) ANKLE FRACTURE  Implants: * No implants in log *  Surgeons: Surgeon(s): Genelle Standing, MD  Anesthesia: General    Estimated Blood Loss: See anesthesia record  Complications: None  Condition to PACU: Stable  Standing LITTIE Genelle, MD 12/06/2023 5:18 PM

## 2023-12-06 NOTE — Anesthesia Preprocedure Evaluation (Addendum)
 Anesthesia Evaluation  Patient identified by MRN, date of birth, ID band Patient awake    Reviewed: Allergy & Precautions, NPO status , Patient's Chart, lab work & pertinent test results  History of Anesthesia Complications Negative for: history of anesthetic complications  Airway Mallampati: II  TM Distance: >3 FB Neck ROM: Full    Dental  (+) Dental Advisory Given, Teeth Intact   Pulmonary neg pulmonary ROS   Pulmonary exam normal        Cardiovascular hypertension, Pt. on medications Normal cardiovascular exam     Neuro/Psych  B/l hearing loss   negative psych ROS   GI/Hepatic Neg liver ROS,GERD  Controlled,,  Endo/Other  diabetes, Type 2, Insulin  Dependent, Oral Hypoglycemic Agents   Continuous Humalog infusion via V-go pump K 3.2   Renal/GU negative Renal ROS     Musculoskeletal  (+) Arthritis ,    Abdominal   Peds  Hematology negative hematology ROS (+)   Anesthesia Other Findings   Reproductive/Obstetrics  Breast cancer                               Anesthesia Physical Anesthesia Plan  ASA: 2  Anesthesia Plan: General   Post-op Pain Management: Tylenol  PO (pre-op)* and Regional block*   Induction: Intravenous  PONV Risk Score and Plan: 3 and Treatment may vary due to age or medical condition, Ondansetron  and Dexamethasone   Airway Management Planned: LMA  Additional Equipment: None  Intra-op Plan:   Post-operative Plan: Extubation in OR  Informed Consent: I have reviewed the patients History and Physical, chart, labs and discussed the procedure including the risks, benefits and alternatives for the proposed anesthesia with the patient or authorized representative who has indicated his/her understanding and acceptance.     Dental advisory given  Plan Discussed with: CRNA and Anesthesiologist  Anesthesia Plan Comments:          Anesthesia Quick  Evaluation

## 2023-12-06 NOTE — Anesthesia Postprocedure Evaluation (Signed)
 Anesthesia Post Note  Patient: Krystal [name]  Procedure(s) Performed: OPEN REDUCTION INTERNAL FIXATION (ORIF) ANKLE FRACTURE (Right: Ankle)     Patient location during evaluation: PACU Anesthesia Type: General Level of consciousness: awake and alert Pain management: pain level controlled Vital Signs Assessment: post-procedure vital signs reviewed and stable Respiratory status: spontaneous breathing, nonlabored ventilation, respiratory function stable and patient connected to nasal cannula oxygen Cardiovascular status: blood pressure returned to baseline and stable Postop Assessment: no apparent nausea or vomiting Anesthetic complications: no   No notable events documented.  Last Vitals:  Vitals:   12/06/23 1815 12/06/23 1830  BP: (!) 150/65 (!) 150/73  Pulse: 81 77  Resp: 13 13  Temp:  (!) 36.1 C  SpO2: 92% 94%    Last Pain:  Vitals:   12/06/23 1815  TempSrc:   PainSc: 0-No pain                 Thom JONELLE Peoples

## 2023-12-06 NOTE — Anesthesia Procedure Notes (Signed)
 Anesthesia Regional Block: Popliteal block   Pre-Anesthetic Checklist: , timeout performed,  Correct Patient, Correct Site, Correct Laterality,  Correct Procedure, Correct Position, site marked,  Risks and benefits discussed,  Surgical consent,  Pre-op evaluation,  At surgeon's request and post-op pain management  Laterality: Right  Prep: chloraprep       Needles:  Injection technique: Single-shot  Needle Type: Echogenic Needle     Needle Length: 10cm  Needle Gauge: 21     Additional Needles:   Narrative:  Start time: 12/06/2023 4:16 PM End time: 12/06/2023 4:19 PM Injection made incrementally with aspirations every 5 mL.  Performed by: Personally  Anesthesiologist: Lucious Debby BRAVO, MD  Additional Notes: No pain on injection. No increased resistance to injection. Injection made in 5cc increments. Good needle visualization. Patient tolerated the procedure well.

## 2023-12-06 NOTE — Progress Notes (Signed)
 Pt is A&O x4, she has a V-go Humalog Insulin  pump on cont basal dose. Pt is saying that her blood sugar is going down fast. Dr Lucious notified. Blood sugar 73.  D50 25 ml given.

## 2023-12-06 NOTE — Anesthesia Procedure Notes (Signed)
 Procedure Name: LMA Insertion Date/Time: 12/06/2023 4:46 PM  Performed by: Lockie Flesher, CRNAPre-anesthesia Checklist: Patient identified, Emergency Drugs available, Suction available and Patient being monitored Patient Re-evaluated:Patient Re-evaluated prior to induction Oxygen Delivery Method: Circle System Utilized Preoxygenation: Pre-oxygenation with 100% oxygen Induction Type: IV induction Ventilation: Mask ventilation without difficulty LMA: LMA inserted LMA Size: 3.0 Number of attempts: 1 Airway Equipment and Method: Bite block Placement Confirmation: positive ETCO2 Tube secured with: Tape Dental Injury: Teeth and Oropharynx as per pre-operative assessment

## 2023-12-07 ENCOUNTER — Encounter (HOSPITAL_COMMUNITY): Payer: Self-pay | Admitting: Orthopaedic Surgery

## 2023-12-08 ENCOUNTER — Ambulatory Visit (HOSPITAL_BASED_OUTPATIENT_CLINIC_OR_DEPARTMENT_OTHER): Admitting: Physician Assistant

## 2023-12-09 ENCOUNTER — Other Ambulatory Visit: Payer: Self-pay

## 2023-12-09 ENCOUNTER — Ambulatory Visit (HOSPITAL_BASED_OUTPATIENT_CLINIC_OR_DEPARTMENT_OTHER): Attending: Orthopaedic Surgery | Admitting: Physical Therapy

## 2023-12-09 ENCOUNTER — Encounter (HOSPITAL_BASED_OUTPATIENT_CLINIC_OR_DEPARTMENT_OTHER): Payer: Self-pay | Admitting: Physical Therapy

## 2023-12-09 DIAGNOSIS — S82891A Other fracture of right lower leg, initial encounter for closed fracture: Secondary | ICD-10-CM | POA: Insufficient documentation

## 2023-12-09 DIAGNOSIS — Y929 Unspecified place or not applicable: Secondary | ICD-10-CM | POA: Insufficient documentation

## 2023-12-09 DIAGNOSIS — Y939 Activity, unspecified: Secondary | ICD-10-CM | POA: Insufficient documentation

## 2023-12-09 DIAGNOSIS — R600 Localized edema: Secondary | ICD-10-CM | POA: Diagnosis not present

## 2023-12-09 DIAGNOSIS — R2689 Other abnormalities of gait and mobility: Secondary | ICD-10-CM | POA: Insufficient documentation

## 2023-12-09 DIAGNOSIS — M25571 Pain in right ankle and joints of right foot: Secondary | ICD-10-CM | POA: Diagnosis present

## 2023-12-09 DIAGNOSIS — M25671 Stiffness of right ankle, not elsewhere classified: Secondary | ICD-10-CM | POA: Insufficient documentation

## 2023-12-09 NOTE — Therapy (Addendum)
 OUTPATIENT PHYSICAL THERAPY LOWER EXTREMITY EVALUATION   Patient Name: Krystal [name] MRN: [id number] DOB:[birth year], 73 y.o., female Today's Date: 12/09/2023  END OF SESSION:  PT End of Session - 12/09/23 1403     Visit Number 1    Number of Visits 16    Date for PT Re-Evaluation 12/09/23    PT Start Time 1347    PT Stop Time 1430    PT Time Calculation (min) 43 min    Activity Tolerance Patient tolerated treatment well    Behavior During Therapy WFL for tasks assessed/performed          Past Medical History:  Diagnosis Date   Acid reflux    Anemia    Arthritis    right thumb - no meds   Breast cancer (HCC) 2005   left breast   Cataract    Diabetes mellitus without complication (HCC)    type 2   Hearing loss    bilateral - no hearing aids   Hyperlipidemia    Hypertension    Personal history of chemotherapy    Personal history of radiation therapy    Past Surgical History:  Procedure Laterality Date   BREAST LUMPECTOMY Left 2005   BREAST SURGERY  2005   Left Br Lumpectomy   CESAREAN SECTION  2 times   chemo and radiation  2005-2006   for breast cancer/ 4 chemo treatments and 36 radiation treatments   COLONOSCOPY     x 2   CYSTOSCOPY N/A 11/25/2015   Procedure: CYSTOSCOPY;  Surgeon: Gigi Botts, MD;  Location: [facility];  Service: Gynecology;  Laterality: N/A;   LAPAROSCOPIC VAGINAL HYSTERECTOMY WITH SALPINGO OOPHORECTOMY Bilateral 11/25/2015   Procedure: LAPAROSCOPIC ASSISTED VAGINAL HYSTERECTOMY WITH BILATERAL SALPINGO OOPHORECTOMY, LAPARSCOPIC CYSTOTOMY REPAIR WITH BILATERAL RETROGRADES;  Surgeon: Gigi Botts, MD;  Location: [facility];  Service: Gynecology;  Laterality: Bilateral;   NASAL SINUS SURGERY     ORIF ANKLE FRACTURE Right 12/06/2023   Procedure: OPEN REDUCTION INTERNAL FIXATION (ORIF) ANKLE FRACTURE;  Surgeon: Genelle Standing, MD;  Location: [facility];  Service: Orthopedics;  Laterality: Right;  RIGHT OPEN REDUCTION INTERNAL FIXATION LATERAL MALLEOLUS    TONSILLECTOMY     TUBAL LIGATION     WISDOM TOOTH EXTRACTION     Patient Active Problem List   Diagnosis Date Noted   Closed right ankle fracture 11/29/2023   Iron deficiency anemia 08/24/2021   S/P laparoscopic assisted vaginal hysterectomy (LAVH) 11/25/2015   History of breast cancer in female 10/25/2011   Sebaceous cyst 10/25/2011    PCP: Dr Charlie Love   REFERRING PROVIDER: Dr Standing Genelle   REFERRING DIAG:   THERAPY DIAG:  Pain in right ankle and joints of right foot  Stiffness of right ankle, not elsewhere classified  Other abnormalities of gait and mobility  Localized edema  Rationale for Evaluation and Treatment: Rehabilitation  ONSET DATE: DOS 12/06/2023  SUBJECTIVE:   SUBJECTIVE STATEMENT: Patient was walking on a wet surface when she slipped and suffered a right fibular fracture.  She had an ORIF performed on 12/06/2023.  At this time her pain is well-controlled.  She is using a scooter for primary mobility.  She does not have a walker.  She is active prior to her fall.  PERTINENT HISTORY: DMII, anemia ( not recent) Breast cancer, hearing loss. PAIN:  Are you having pain? Yes: NPRS scale: 2-3 Pain location: incision  Pain description: burning  Aggravating factors: by the end of the day  Relieving factors: rest  PRECAUTIONS: None  RED FLAGS: None   WEIGHT BEARING RESTRICTIONS: Yes WBAT   FALLS:  Has patient fallen in last 6 months? Just her slip causing her fx   LIVING ENVIRONMENT[place]  OCCUPATION:  Retired  Presenter, broadcasting: active prior to fall   PLOF: Independent  PATIENT GOALS:  To return to active lifestyle   NEXT MD VISIT:  Next week   OBJECTIVE:  Note: Objective measures were completed at Evaluation unless otherwise noted.  DIAGNOSTIC FINDINGS: Nothing post op   PATIENT SURVEYS:  LEFS  Extreme difficulty/unable (0), Quite a bit of difficulty (1), Moderate difficulty (2), Little difficulty  (3), No difficulty (4) Survey date:    Any of your usual work, housework or school activities   2. Usual hobbies, recreational or sporting activities   3. Getting into/out of the bath   4. Walking between rooms   5. Putting on socks/shoes   6. Squatting    7. Lifting an object, like a bag of groceries from the floor   8. Performing light activities around your home   9. Performing heavy activities around your home   10. Getting into/out of a car   11. Walking 2 blocks   12. Walking 1 mile   13. Going up/down 10 stairs (1 flight)   14. Standing for 1 hour   15.  sitting for 1 hour   16. Running on even ground   17. Running on uneven ground   18. Making sharp turns while running fast   19. Hopping    20. Rolling over in bed   Score total:  6/80     COGNITION: Overall cognitive status: Within functional limits for tasks assessed     SENSATION: WFL some burning in the   EDEMA:  Figure 8:     POSTURE: No Significant postural limitations  PALPATION: No unexpected TTP   LOWER EXTREMITY ROM:  Passive ROM Right eval Left eval  Hip flexion    Hip extension    Hip abduction    Hip adduction    Hip internal rotation    Hip external rotation    Knee flexion    Knee extension    Ankle dorsiflexion  2  Ankle plantarflexion  8  Ankle inversion  nt  Ankle eversion  nt   (Blank rows = not tested)  LOWER EXTREMITY MMT:  MMT Right eval Left eval  Hip flexion    Hip extension    Hip abduction    Hip adduction    Hip internal rotation    Hip external rotation    Knee flexion    Knee extension    Ankle dorsiflexion    Ankle plantarflexion    Ankle inversion    Ankle eversion     (Blank rows = not tested)    GAIT:  TREATMENT DATE:    Ther-ex:  PROM into DF  Ankle pumps 2x10  SLR 2x10  LAQ 2x10    Gait:  Review of how to  use walker and how to set walker for height. Ambulation 100' with walker      PATIENT EDUCATION:  Education details: HEP, symptom management  Person educated: Patient Education method: Explanation, Demonstration, Tactile cues, Verbal cues, and Handouts Education comprehension: verbalized understanding, returned demonstration, verbal cues required, tactile cues required, and needs further education  HOME EXERCISE PROGRAM: URL: [email or web address] Date: 12/09/2023 Prepared by: Alm Don  Exercises - Supine Active Straight Leg Raise  - 1 x daily - 7 x weekly - 3 sets - 10 reps - Seated Ankle Pumps  - 4-5 x daily - 7 x weekly - 3 sets - 10 reps - Seated Long Arc Quad  - 1 x daily - 7 x weekly - 3 sets - 10 reps  ASSESSMENT:  CLINICAL IMPRESSION: Patient is a 54-year-old female status post right fibular ORIF performed on 12/06/2023.  She presents with expected limitations in range of motion, strength, and general functional mobility.  At this time her swelling is well-controlled.  Her pain is well-controlled as well.  She is using a scooter for primary mobility.  We have ordered a walker for her which should be coming to her house pretty soon.  She is weightbearing as tolerated.  Encouraged her to weight-bear on it as she is able.  We reviewed proper gait pattern with her today. She presents with expected limitations in motion, strength, and general functional mobility.  OBJECTIVE IMPAIRMENTS: Abnormal gait, decreased activity tolerance, decreased mobility, difficulty walking, decreased ROM, decreased strength, increased edema, and pain.   ACTIVITY LIMITATIONS: carrying, lifting, bending, standing, squatting, stairs, transfers, bathing, dressing, and locomotion level  PARTICIPATION LIMITATIONS: meal prep, cleaning, laundry, driving, shopping, community activity, and yard work  PERSONAL FACTORS: 1 comorbidity: DMII are also affecting patient's functional outcome.   REHAB  POTENTIAL: Excellent  CLINICAL DECISION MAKING: Stable/uncomplicated  EVALUATION COMPLEXITY: Low   GOALS: Goals reviewed with patient? Yes  SHORT TERM GOALS: Target date: 01/06/2024   Patient will increase right ankle DF to 10 degrees  Baseline: Goal status: INITIAL  2.  Patient will wean out of the boot Baseline:  Goal status: INITIAL  3.  Patient will ambulate 100' with no AD  Baseline:  Goal status: INITIAL   LONG TERM GOALS: Target date: 02/03/2024    Patient will ambulate community distances without pain  Baseline:  Goal status: INITIAL  2.  Patient will go up/down 8 steps with reciprocal gait pattern  Baseline:  Goal status: INITIAL  3.  Patient will return to full exercise program without pain  Baseline:  Goal status: INITIAL    PLAN:  PT FREQUENCY: 1-2x/week  PT DURATION: 8 weeks  PLANNED INTERVENTIONS: 97110-Therapeutic exercises, 97530- Therapeutic activity, [id number]- Neuromuscular re-education, 97535- Self Care, [id number]- Manual therapy, [id number]- Gait training, [phone]- Aquatic Therapy, 97014- Electrical stimulation (unattended), 97035- Ultrasound, Patient/Family education, Stair training, Taping, Dry Needling, DME instructions, Cryotherapy, and Moist heat   PLAN FOR NEXT SESSION:  Continue with PROM into DF. Continue gross LE strengthening. Progress ambulation in boot. Review walking with her walker if she brings it.      Alm JINNY Don, PT 12/09/2023, 3:51 PM   Date of referral: 12/05/23 Referring provider:  Genelle Standing, MD    Referring diagnosis?  D17.108J (ICD-10-CM) - Closed fracture of right ankle, initial encounter  Treatment diagnosis? (if different than referring diagnosis) M25.571 M25.671 R26.89 R60.0  What was this (referring dx) caused by? Surgery (Type: ORIF) and [place] of Condition: Initial Onset (within last 3 months)   Laterality: Rt  Current Functional Measure Score: LEFS 6/80  Objective measurements identify  impairments when they are compared to normal values, the uninvolved extremity, and prior level of function.  [x]  Yes  []  No  Objective assessment of functional ability: Severe functional limitations   Briefly describe symptoms: burning since surgery  How did symptoms start: s/p slip and fall  Average pain intensity:  Last 24 hours: 2-3  Past week: severe post fall  How often does the pt experience symptoms? Constantly  How much have the symptoms interfered with usual daily activities? Extremely  How has condition changed since care began at this facility? NA - initial visit  In general, how is the patients overall health? Very Good   BACK PAIN (STarT Back Screening Tool) No  Jessica C. Hightower PT, DPT 12/12/23 7:40 AM

## 2023-12-15 ENCOUNTER — Ambulatory Visit (HOSPITAL_BASED_OUTPATIENT_CLINIC_OR_DEPARTMENT_OTHER): Admitting: Physical Therapy

## 2023-12-15 DIAGNOSIS — M25571 Pain in right ankle and joints of right foot: Secondary | ICD-10-CM

## 2023-12-15 DIAGNOSIS — S82891A Other fracture of right lower leg, initial encounter for closed fracture: Secondary | ICD-10-CM | POA: Diagnosis not present

## 2023-12-15 DIAGNOSIS — R600 Localized edema: Secondary | ICD-10-CM

## 2023-12-15 DIAGNOSIS — M25671 Stiffness of right ankle, not elsewhere classified: Secondary | ICD-10-CM

## 2023-12-15 DIAGNOSIS — R2689 Other abnormalities of gait and mobility: Secondary | ICD-10-CM

## 2023-12-15 NOTE — Therapy (Signed)
 OUTPATIENT PHYSICAL THERAPY LOWER EXTREMITY TREATMENT   Patient Name: KEMBER BOCH MRN: [id number] DOB:Sep 21, 1950, 73 y.o., female Today's Date: 12/16/2023  END OF SESSION:  PT End of Session - 12/16/23 2205     Visit Number 2    Number of Visits 16    Date for PT Re-Evaluation 02/03/24    Authorization Type [employer] MCR    Authorization Time Period 12/15/23 - 02/14/24    Authorization - Number of Visits 10    PT Start Time 1540    PT Stop Time 1616    PT Time Calculation (min) 36 min    Activity Tolerance Patient tolerated treatment well    Behavior During Therapy WFL for tasks assessed/performed           Past Medical History:  Diagnosis Date   Acid reflux    Anemia    Arthritis    right thumb - no meds   Breast cancer (HCC) 2005   left breast   Cataract    Diabetes mellitus without complication (HCC)    type 2   Hearing loss    bilateral - no hearing aids   Hyperlipidemia    Hypertension    Personal history of chemotherapy    Personal history of radiation therapy    Past Surgical History:  Procedure Laterality Date   BREAST LUMPECTOMY Left 2005   BREAST SURGERY  2005   Left Br Lumpectomy   CESAREAN SECTION  2 times   chemo and radiation  2005-2006   for breast cancer/ 4 chemo treatments and 36 radiation treatments   COLONOSCOPY     x 2   CYSTOSCOPY N/A 11/25/2015   Procedure: CYSTOSCOPY;  Surgeon: Gigi Botts, MD;  Location: [facility];  Service: Gynecology;  Laterality: N/A;   LAPAROSCOPIC VAGINAL HYSTERECTOMY WITH SALPINGO OOPHORECTOMY Bilateral 11/25/2015   Procedure: LAPAROSCOPIC ASSISTED VAGINAL HYSTERECTOMY WITH BILATERAL SALPINGO OOPHORECTOMY, LAPARSCOPIC CYSTOTOMY REPAIR WITH BILATERAL RETROGRADES;  Surgeon: Gigi Botts, MD;  Location: [facility];  Service: Gynecology;  Laterality: Bilateral;   NASAL SINUS SURGERY     ORIF ANKLE FRACTURE Right 12/06/2023   Procedure: OPEN REDUCTION INTERNAL FIXATION (ORIF) ANKLE FRACTURE;  Surgeon: Genelle Standing, MD;  Location:  [facility];  Service: Orthopedics;  Laterality: Right;  RIGHT OPEN REDUCTION INTERNAL FIXATION LATERAL MALLEOLUS   TONSILLECTOMY     TUBAL LIGATION     WISDOM TOOTH EXTRACTION     Patient Active Problem List   Diagnosis Date Noted   Closed right ankle fracture 11/29/2023   Iron deficiency anemia 08/24/2021   S/P laparoscopic assisted vaginal hysterectomy (LAVH) 11/25/2015   History of breast cancer in female 10/25/2011   Sebaceous cyst 10/25/2011    PCP: Dr Charlie Love   REFERRING PROVIDER: Dr Standing Genelle   REFERRING DIAG:   THERAPY DIAG:  Pain in right ankle and joints of right foot  Stiffness of right ankle, not elsewhere classified  Other abnormalities of gait and mobility  Localized edema  Rationale for Evaluation and Treatment: Rehabilitation  ONSET DATE: DOS 12/06/2023  SUBJECTIVE:   SUBJECTIVE STATEMENT: Pt is 1 week and 2 days s/p ORIF R distal fibula.  Pt states her ankle is not waking her up at night.  Pt denies any adverse effects after prior Rx.  Pt has iced her ankle with ankle in elevation twice.  Pt reports compliance with HEP.  Pt reports she is not having pain with ambulation.   She had an ORIF performed on 12/06/2023.   PERTINENT  HISTORY: DMII, anemia ( not recent) Breast cancer, hearing loss.  PAIN:  Are you having pain? Yes: NPRS scale: 0-1/10 Pain location: lateral ankle  Pain description: burning  Aggravating factors: by the end of the day  Relieving factors: rest   PRECAUTIONS: None  RED FLAGS: None   WEIGHT BEARING RESTRICTIONS: Yes WBAT    FALLS:  Has patient fallen in last 6 months? Just her slip causing her fx   LIVING ENVIRONMENT[place]  OCCUPATION:  Retired  Presenter, broadcasting: active prior to fall   PLOF: Independent  PATIENT GOALS:  To return to active lifestyle   NEXT MD VISIT:  Next week   OBJECTIVE:  Note: Objective measures were completed at Evaluation unless otherwise  noted.  DIAGNOSTIC FINDINGS: Nothing post op   PATIENT SURVEYS:  LEFS  Extreme difficulty/unable (0), Quite a bit of difficulty (1), Moderate difficulty (2), Little difficulty (3), No difficulty (4) Survey date:    Any of your usual work, housework or school activities   2. Usual hobbies, recreational or sporting activities   3. Getting into/out of the bath   4. Walking between rooms   5. Putting on socks/shoes   6. Squatting    7. Lifting an object, like a bag of groceries from the floor   8. Performing light activities around your home   9. Performing heavy activities around your home   10. Getting into/out of a car   11. Walking 2 blocks   12. Walking 1 mile   13. Going up/down 10 stairs (1 flight)   14. Standing for 1 hour   15.  sitting for 1 hour   16. Running on even ground   17. Running on uneven ground   18. Making sharp turns while running fast   19. Hopping    20. Rolling over in bed   Score total:  6/80     COGNITION: Overall cognitive status: Within functional limits for tasks assessed     SENSATION: WFL some burning in the   EDEMA:  Figure 8:     POSTURE: No Significant postural limitations  PALPATION: No unexpected TTP   LOWER EXTREMITY ROM:  Passive ROM Right eval Left eval  Hip flexion    Hip extension    Hip abduction    Hip adduction    Hip internal rotation    Hip external rotation    Knee flexion    Knee extension    Ankle dorsiflexion  2  Ankle plantarflexion  8  Ankle inversion  nt  Ankle eversion  nt   (Blank rows = not tested)  LOWER EXTREMITY MMT:  MMT Right eval Left eval  Hip flexion    Hip extension    Hip abduction    Hip adduction    Hip internal rotation    Hip external rotation    Knee flexion    Knee extension    Ankle dorsiflexion    Ankle plantarflexion    Ankle inversion    Ankle eversion     (Blank rows = not tested)    GAIT:  TREATMENT DATE:  12/15/23:  See below for pt education.   PT removed her post op bandage.  Pt did have some dried blood on bandage.  Stitches present in incision.  PT applied new gauze and tegaderm over incision.  PT educated pt concerning dressings.    Toe flexion/extension AROM x20 reps Quad set with 5 sec hold x 10 reps Supine SLR x10 reps     PATIENT EDUCATION:  Education details:  post op protocol limitations and restrictions, Wb'ing restrictions, appropriate foot wear, ice usage and elevation, exercise form, symptom management, incision, and dressings.  PT answered pt's questions. Person educated: Patient Education method: Explanation, Demonstration, Tactile cues, Verbal cues Education comprehension: verbalized understanding, returned demonstration, verbal cues required, tactile cues required, and needs further education  HOME EXERCISE PROGRAM: URL: [email or web address] Date: 12/09/2023 Prepared by: Alm Don  Exercises - Supine Active Straight Leg Raise  - 1 x daily - 7 x weekly - 3 sets - 10 reps - Seated Ankle Pumps  - 4-5 x daily - 7 x weekly - 3 sets - 10 reps - Seated Long Arc Quad  - 1 x daily - 7 x weekly - 3 sets - 10 reps  ASSESSMENT:  CLINICAL IMPRESSION: Pt presents to treatment with a cane without her boot.  Pt is wearing sandals and is ambulating without pain.  Pt states MD informed her she could walk without her boot depending on tolerance.  PT messaged MD concerning Wb'ing restrictions.  MD replied it is okay to WB without boot.  PT instructed pt in Wb'ing restrictions and educated pt in proper footwear.  PT answered pt's questions.  PT removed her post op bandage and applied new gauze and tegaderm over incision.  Pt performed exercises per protocol well with cuing for correct form.  She responded well to Rx having no pain and no c/o's after Rx.  Pt will benefit from skilled PT per  protocol to address goals and impairments and improve overall function.      OBJECTIVE IMPAIRMENTS: Abnormal gait, decreased activity tolerance, decreased mobility, difficulty walking, decreased ROM, decreased strength, increased edema, and pain.   ACTIVITY LIMITATIONS: carrying, lifting, bending, standing, squatting, stairs, transfers, bathing, dressing, and locomotion level  PARTICIPATION LIMITATIONS: meal prep, cleaning, laundry, driving, shopping, community activity, and yard work  PERSONAL FACTORS: 1 comorbidity: DMII are also affecting patient's functional outcome.   REHAB POTENTIAL: Excellent  CLINICAL DECISION MAKING: Stable/uncomplicated  EVALUATION COMPLEXITY: Low   GOALS: Goals reviewed with patient? Yes  SHORT TERM GOALS: Target date: 01/06/2024   Patient will increase right ankle DF to 10 degrees  Baseline: Goal status: INITIAL  2.  Patient will wean out of the boot Baseline:  Goal status: INITIAL  3.  Patient will ambulate 100' with no AD  Baseline:  Goal status: INITIAL   LONG TERM GOALS: Target date: 02/03/2024    Patient will ambulate community distances without pain  Baseline:  Goal status: INITIAL  2.  Patient will go up/down 8 steps with reciprocal gait pattern  Baseline:  Goal status: INITIAL  3.  Patient will return to full exercise program without pain  Baseline:  Goal status: INITIAL    PLAN:  PT FREQUENCY: 1-2x/week  PT DURATION: 8 weeks  PLANNED INTERVENTIONS: 97110-Therapeutic exercises, 97530- Therapeutic activity, [id number]- Neuromuscular re-education, 97535- Self Care, 02859- Manual therapy, [id number]- Gait training, [phone]- Aquatic Therapy, 97014- Electrical stimulation (unattended), [phone]- Ultrasound, Patient/Family education, Stair training, Taping, Dry Needling, DME instructions, Cryotherapy, and Moist heat  PLAN FOR NEXT SESSION:  Continue with PROM into DF. Continue gross LE strengthening. Progress ambulation in boot. Review  walking with her walker if she brings it.     Leigh Minerva III PT, DPT 12/17/23 12:52 AM

## 2023-12-17 ENCOUNTER — Encounter (HOSPITAL_BASED_OUTPATIENT_CLINIC_OR_DEPARTMENT_OTHER): Payer: Self-pay | Admitting: Physical Therapy

## 2023-12-21 ENCOUNTER — Ambulatory Visit (HOSPITAL_BASED_OUTPATIENT_CLINIC_OR_DEPARTMENT_OTHER)

## 2023-12-21 ENCOUNTER — Ambulatory Visit (HOSPITAL_BASED_OUTPATIENT_CLINIC_OR_DEPARTMENT_OTHER): Admitting: Orthopaedic Surgery

## 2023-12-21 DIAGNOSIS — S82891A Other fracture of right lower leg, initial encounter for closed fracture: Secondary | ICD-10-CM

## 2023-12-21 NOTE — Progress Notes (Signed)
 Chief Complaint: Right ankle open reduction internal fixation 7/29     History of Present Illness:    Krystal [name] is a 73 y.o. female presents 2 weeks status post above procedure.  Overall doing very well.  She is now walking without any type of boot.  She is fully weightbearing doing quite well    PMH/PSH/Family History/Social History/Meds/Allergies:    Past Medical History:  Diagnosis Date   Acid reflux    Anemia    Arthritis    right thumb - no meds   Breast cancer (HCC) 2005   left breast   Cataract    Diabetes mellitus without complication (HCC)    type 2   Hearing loss    bilateral - no hearing aids   Hyperlipidemia    Hypertension    Personal history of chemotherapy    Personal history of radiation therapy    Past Surgical History:  Procedure Laterality Date   BREAST LUMPECTOMY Left 2005   BREAST SURGERY  2005   Left Br Lumpectomy   CESAREAN SECTION  2 times   chemo and radiation  2005-2006   for breast cancer/ 4 chemo treatments and 36 radiation treatments   COLONOSCOPY     x 2   CYSTOSCOPY N/A 11/25/2015   Procedure: CYSTOSCOPY;  Surgeon: Gigi Botts, MD;  Location: [facility];  Service: Gynecology;  Laterality: N/A;   LAPAROSCOPIC VAGINAL HYSTERECTOMY WITH SALPINGO OOPHORECTOMY Bilateral 11/25/2015   Procedure: LAPAROSCOPIC ASSISTED VAGINAL HYSTERECTOMY WITH BILATERAL SALPINGO OOPHORECTOMY, LAPARSCOPIC CYSTOTOMY REPAIR WITH BILATERAL RETROGRADES;  Surgeon: Gigi Botts, MD;  Location: [facility];  Service: Gynecology;  Laterality: Bilateral;   NASAL SINUS SURGERY     ORIF ANKLE FRACTURE Right 12/06/2023   Procedure: OPEN REDUCTION INTERNAL FIXATION (ORIF) ANKLE FRACTURE;  Surgeon: Genelle Standing, MD;  Location: [facility];  Service: Orthopedics;  Laterality: Right;  RIGHT OPEN REDUCTION INTERNAL FIXATION LATERAL MALLEOLUS   TONSILLECTOMY     TUBAL LIGATION     WISDOM TOOTH EXTRACTION     Social History   Socioeconomic History   Marital status: Married     Spouse name: Not on file   Number of children: Not on file   Years of education: Not on file   Highest education level: Not on file  Occupational History   Not on file  Tobacco Use   Smoking status: Never   Smokeless tobacco: Never  Substance and Sexual Activity   Alcohol use: No   Drug use: No   Sexual activity: Yes    Birth control/protection: Post-menopausal  Other Topics Concern   Not on file  Social History Narrative   Not on file   Social Drivers of Health   Financial Resource Strain: Not on file  Food Insecurity: Not on file  Transportation Needs: Not on file  Physical Activity: Not on file  Stress: Not on file  Social Connections: Not on file   Family History  Problem Relation Age of Onset   Heart disease Mother    Heart disease Father    Breast cancer Maternal Aunt    No Known Allergies Current Outpatient Medications  Medication Sig Dispense Refill   acetaminophen  (TYLENOL ) 500 MG tablet Take 500 mg by mouth every 6 (six) hours as needed for moderate pain (pain score 4-6).     aspirin  EC 325 MG tablet Take 1 tablet (325 mg total) by mouth daily. 14 tablet 0   calcium carbonate (TUMS EX) 750 MG chewable tablet Chew  1 tablet by mouth as needed for heartburn.     ferrous sulfate  325 (65 FE) MG EC tablet TAKE 1 TABLET BY MOUTH TWO TIMES A DAY BEFORE A MEAL (Patient taking differently: Take 325 mg by mouth daily with breakfast.) 60 tablet 3   HUMALOG 100 UNIT/ML injection Inject into the skin See admin instructions. Uses in Insulin  Pump     Insulin  Human (INSULIN  PUMP) SOLN Inject 1 each into the skin continuous. V-Go Disposable Insulin  Delivery Device administers 78 units per 24 hours of insulin  lispro (Humalog).     Lancets (ONETOUCH ULTRASOFT) lancets      metFORMIN  (GLUCOPHAGE ) 500 MG tablet Take 1,000 mg by mouth 2 (two) times daily with a meal.     ONE TOUCH ULTRA TEST test strip      oxyCODONE  (ROXICODONE ) 5 MG immediate release tablet Take 1 tablet (5 mg  total) by mouth every 4 (four) hours as needed for severe pain (pain score 7-10) or breakthrough pain. 20 tablet 0   pravastatin (PRAVACHOL) 40 MG tablet Take 40 mg by mouth at bedtime.     valsartan-hydrochlorothiazide (DIOVAN-HCT) 160-25 MG per tablet Take 1 tablet by mouth Daily.     No current facility-administered medications for this visit.   Facility-Administered Medications Ordered in Other Visits  Medication Dose Route Frequency Provider Last Rate Last Admin   iopamidol  (ISOVUE -300) 61 % injection 90 mL  90 mL Other Once PRN Pinn, Walda, MD       iopamidol  (ISOVUE -300) 61 % injection 90 mL  90 mL Intravenous Once PRN Pinn, Walda, MD       No results found.  Review of Systems:   A ROS was performed including pertinent positives and negatives as documented in the HPI.  Physical Exam :   Constitutional: NAD and appears stated age Neurological: Alert and oriented Psych: Appropriate affect and cooperative There were no vitals taken for this visit.   Comprehensive Musculoskeletal Exam:    Right ankle with minimal swelling and no tenderness about the joint line.  Incision is well-appearing without erythema or drainage.  Distal neurosensory exam is intact   Imaging:   Xray (3 views right ankle): Status post open reduction internal fixation without evidence of complication     I personally reviewed and interpreted the radiographs.   Assessment and Plan:   73 y.o. female status post right ankle open reduction internal fixation without evidence of complication overall doing extremely well.  This time we will plan to see her back in 4 weeks for reassessment she will discontinue her boot - Return to clinic 4 weeks for reassessment   I personally saw and evaluated the patient, and participated in the management and treatment plan.  Elspeth Parker, MD Attending Physician, Orthopedic Surgery  This document was dictated using Dragon voice recognition software. A reasonable  attempt at proof reading has been made to minimize errors.

## 2023-12-23 ENCOUNTER — Encounter (HOSPITAL_BASED_OUTPATIENT_CLINIC_OR_DEPARTMENT_OTHER): Payer: Self-pay | Admitting: Physical Therapy

## 2023-12-23 ENCOUNTER — Ambulatory Visit (HOSPITAL_BASED_OUTPATIENT_CLINIC_OR_DEPARTMENT_OTHER): Admitting: Physical Therapy

## 2023-12-23 DIAGNOSIS — M25571 Pain in right ankle and joints of right foot: Secondary | ICD-10-CM

## 2023-12-23 DIAGNOSIS — S82891A Other fracture of right lower leg, initial encounter for closed fracture: Secondary | ICD-10-CM | POA: Diagnosis not present

## 2023-12-23 DIAGNOSIS — R600 Localized edema: Secondary | ICD-10-CM

## 2023-12-23 DIAGNOSIS — M25671 Stiffness of right ankle, not elsewhere classified: Secondary | ICD-10-CM

## 2023-12-23 DIAGNOSIS — R2689 Other abnormalities of gait and mobility: Secondary | ICD-10-CM

## 2023-12-23 NOTE — Therapy (Signed)
 OUTPATIENT PHYSICAL THERAPY LOWER EXTREMITY TREATMENT   Patient Name: Krystal [name] MRN: [id number] DOB:07/24/1950, 73 y.o., female Today's Date: 12/23/2023  END OF SESSION:  PT End of Session - 12/23/23 1107     Visit Number 3    Number of Visits 16    Date for PT Re-Evaluation 02/03/24    PT Start Time 1101    PT Stop Time 1145    PT Time Calculation (min) 44 min    Activity Tolerance Patient tolerated treatment well    Behavior During Therapy WFL for tasks assessed/performed           Past Medical History:  Diagnosis Date   Acid reflux    Anemia    Arthritis    right thumb - no meds   Breast cancer (HCC) 2005   left breast   Cataract    Diabetes mellitus without complication (HCC)    type 2   Hearing loss    bilateral - no hearing aids   Hyperlipidemia    Hypertension    Personal history of chemotherapy    Personal history of radiation therapy    Past Surgical History:  Procedure Laterality Date   BREAST LUMPECTOMY Left 2005   BREAST SURGERY  2005   Left Br Lumpectomy   CESAREAN SECTION  2 times   chemo and radiation  2005-2006   for breast cancer/ 4 chemo treatments and 36 radiation treatments   COLONOSCOPY     x 2   CYSTOSCOPY N/A 11/25/2015   Procedure: CYSTOSCOPY;  Surgeon: Gigi Botts, MD;  Location: [facility];  Service: Gynecology;  Laterality: N/A;   LAPAROSCOPIC VAGINAL HYSTERECTOMY WITH SALPINGO OOPHORECTOMY Bilateral 11/25/2015   Procedure: LAPAROSCOPIC ASSISTED VAGINAL HYSTERECTOMY WITH BILATERAL SALPINGO OOPHORECTOMY, LAPARSCOPIC CYSTOTOMY REPAIR WITH BILATERAL RETROGRADES;  Surgeon: Gigi Botts, MD;  Location: [facility];  Service: Gynecology;  Laterality: Bilateral;   NASAL SINUS SURGERY     ORIF ANKLE FRACTURE Right 12/06/2023   Procedure: OPEN REDUCTION INTERNAL FIXATION (ORIF) ANKLE FRACTURE;  Surgeon: Genelle Standing, MD;  Location: [facility];  Service: Orthopedics;  Laterality: Right;  RIGHT OPEN REDUCTION INTERNAL FIXATION LATERAL MALLEOLUS    TONSILLECTOMY     TUBAL LIGATION     WISDOM TOOTH EXTRACTION     Patient Active Problem List   Diagnosis Date Noted   Closed right ankle fracture 11/29/2023   Iron deficiency anemia 08/24/2021   S/P laparoscopic assisted vaginal hysterectomy (LAVH) 11/25/2015   History of breast cancer in female 10/25/2011   Sebaceous cyst 10/25/2011    PCP: Dr Charlie Love   REFERRING PROVIDER: Dr Standing Genelle   REFERRING DIAG:   THERAPY DIAG:  No diagnosis found.  Rationale for Evaluation and Treatment: Rehabilitation  ONSET DATE: DOS 12/06/2023  SUBJECTIVE:   SUBJECTIVE STATEMENT: The patient continues to do very well. She has been out of the boot. She has had intemrittentswellign but no pain.    She had an ORIF performed on 12/06/2023.   PERTINENT HISTORY: DMII, anemia ( not recent) Breast cancer, hearing loss.  PAIN:  Are you having pain? Yes: NPRS scale: 0-1/10 Pain location: lateral ankle  Pain description: burning  Aggravating factors: by the end of the day  Relieving factors: rest   PRECAUTIONS: None  RED FLAGS: None   WEIGHT BEARING RESTRICTIONS: Yes WBAT    FALLS:  Has patient fallen in last 6 months? Just her slip causing her fx   LIVING ENVIRONMENT[place]  OCCUPATION:  Retired  Presenter, broadcasting: active prior to fall   PLOF: Independent  PATIENT GOALS:  To return to active lifestyle   NEXT MD VISIT:  Next week   OBJECTIVE:  Note: Objective measures were completed at Evaluation unless otherwise noted.  DIAGNOSTIC FINDINGS: Nothing post op   PATIENT SURVEYS:  LEFS  Extreme difficulty/unable (0), Quite a bit of difficulty (1), Moderate difficulty (2), Little difficulty (3), No difficulty (4) Survey date:    Any of your usual work, housework or school activities   2. Usual hobbies, recreational or sporting activities   3. Getting into/out of the bath   4. Walking between rooms   5. Putting on socks/shoes   6.  Squatting    7. Lifting an object, like a bag of groceries from the floor   8. Performing light activities around your home   9. Performing heavy activities around your home   10. Getting into/out of a car   11. Walking 2 blocks   12. Walking 1 mile   13. Going up/down 10 stairs (1 flight)   14. Standing for 1 hour   15.  sitting for 1 hour   16. Running on even ground   17. Running on uneven ground   18. Making sharp turns while running fast   19. Hopping    20. Rolling over in bed   Score total:  6/80     COGNITION: Overall cognitive status: Within functional limits for tasks assessed     SENSATION: WFL some burning in the   EDEMA:  Figure 8:     POSTURE: No Significant postural limitations  PALPATION: No unexpected TTP   LOWER EXTREMITY ROM:  Passive ROM Right eval Left eval  Hip flexion    Hip extension    Hip abduction    Hip adduction    Hip internal rotation    Hip external rotation    Knee flexion    Knee extension    Ankle dorsiflexion  2  Ankle plantarflexion  8  Ankle inversion  nt  Ankle eversion  nt   (Blank rows = not tested)  LOWER EXTREMITY MMT:  MMT Right eval Left eval  Hip flexion    Hip extension    Hip abduction    Hip adduction    Hip internal rotation    Hip external rotation    Knee flexion    Knee extension    Ankle dorsiflexion    Ankle plantarflexion    Ankle inversion    Ankle eversion     (Blank rows = not tested)    GAIT:                                                                                                                                TREATMENT DATE:  8/15 Manual: Trigger point release to medial gastroc  Ankle DF stretch with distraction   There-ex:  Ankle  4 way t-band red 2x15   Neuro-re-ed:  Wobble board x20  Heel/toe rock 3x10  Slow march 3x10       12/15/23:  See below for pt education.   PT removed her post op bandage.  Pt did have some dried blood on bandage.  Stitches  present in incision.  PT applied new gauze and tegaderm over incision.  PT educated pt concerning dressings.    Toe flexion/extension AROM x20 reps Quad set with 5 sec hold x 10 reps Supine SLR x10 reps     PATIENT EDUCATION:  Education details:  post op protocol limitations and restrictions, Wb'ing restrictions, appropriate foot wear, ice usage and elevation, exercise form, symptom management, incision, and dressings.  PT answered pt's questions. Person educated: Patient Education method: Explanation, Demonstration, Tactile cues, Verbal cues Education comprehension: verbalized understanding, returned demonstration, verbal cues required, tactile cues required, and needs further education  HOME EXERCISE PROGRAM: URL: [email or web address] Date: 12/09/2023 Prepared by: Alm Don  Exercises - Supine Active Straight Leg Raise  - 1 x daily - 7 x weekly - 3 sets - 10 reps - Seated Ankle Pumps  - 4-5 x daily - 7 x weekly - 3 sets - 10 reps - Seated Long Arc Quad  - 1 x daily - 7 x weekly - 3 sets - 10 reps  ASSESSMENT:  CLINICAL IMPRESSION: The patient continues to do well. She had full ROM today. We added in focused ankle strengthening exercises as well as standing weight bearing exercises. She d no significant increase in pain. We gave her an updated HEP. She hopes to do 1 more visit. We will test her on stairs next visit and instability and proceed per how she reacts.    OBJECTIVE IMPAIRMENTS: Abnormal gait, decreased activity tolerance, decreased mobility, difficulty walking, decreased ROM, decreased strength, increased edema, and pain.   ACTIVITY LIMITATIONS: carrying, lifting, bending, standing, squatting, stairs, transfers, bathing, dressing, and locomotion level  PARTICIPATION LIMITATIONS: meal prep, cleaning, laundry, driving, shopping, community activity, and yard work  PERSONAL FACTORS: 1 comorbidity: DMII are also affecting patient's functional outcome.    REHAB POTENTIAL: Excellent  CLINICAL DECISION MAKING: Stable/uncomplicated  EVALUATION COMPLEXITY: Low   GOALS: Goals reviewed with patient? Yes  SHORT TERM GOALS: Target date: 01/06/2024   Patient will increase right ankle DF to 10 degrees  Baseline: Goal status: achieved 8/15   2.  Patient will wean out of the boot Baseline:  Goal status: INITIAL  3.  Patient will ambulate 100' with no AD  Baseline:  Goal status: INITIAL   LONG TERM GOALS: Target date: 02/03/2024    Patient will ambulate community distances without pain  Baseline:  Goal status: INITIAL  2.  Patient will go up/down 8 steps with reciprocal gait pattern  Baseline:  Goal status: INITIAL  3.  Patient will return to full exercise program without pain  Baseline:  Goal status: INITIAL    PLAN:  PT FREQUENCY: 1-2x/week  PT DURATION: 8 weeks  PLANNED INTERVENTIONS: 97110-Therapeutic exercises, 97530- Therapeutic activity, [id number]- Neuromuscular re-education, 97535- Self Care, [id number]- Manual therapy, [id number]- Gait training, [phone]- Aquatic Therapy, [id number]- Electrical stimulation (unattended), 97035- Ultrasound, Patient/Family education, Stair training, Taping, Dry Needling, DME instructions, Cryotherapy, and Moist heat   PLAN FOR NEXT SESSION:  Continue with PROM into DF. Continue gross LE strengthening. Progress ambulation in boot. Review walking with her walker if she brings it.    Alm Don PT DPT 12/23/23 11:08 AM

## 2023-12-30 ENCOUNTER — Ambulatory Visit (HOSPITAL_BASED_OUTPATIENT_CLINIC_OR_DEPARTMENT_OTHER): Admitting: Physical Therapy

## 2023-12-30 DIAGNOSIS — R2689 Other abnormalities of gait and mobility: Secondary | ICD-10-CM

## 2023-12-30 DIAGNOSIS — R600 Localized edema: Secondary | ICD-10-CM

## 2023-12-30 DIAGNOSIS — M25671 Stiffness of right ankle, not elsewhere classified: Secondary | ICD-10-CM

## 2023-12-30 DIAGNOSIS — M25571 Pain in right ankle and joints of right foot: Secondary | ICD-10-CM

## 2023-12-30 DIAGNOSIS — S82891A Other fracture of right lower leg, initial encounter for closed fracture: Secondary | ICD-10-CM | POA: Diagnosis not present

## 2023-12-30 NOTE — Therapy (Signed)
 OUTPATIENT PHYSICAL THERAPY LOWER EXTREMITY TREATMENT   Patient Name: Krystal [name] MRN: [id number] DOB:[birth year], 73 y.o., female Today's Date: 01/01/2024  END OF SESSION:  PT End of Session - 01/01/24 2025     Visit Number 4    Number of Visits 16    Date for PT Re-Evaluation 02/03/24    Authorization Type [employer] MCR    PT Start Time 1100    PT Stop Time 1140    PT Time Calculation (min) 40 min    Activity Tolerance Patient tolerated treatment well    Behavior During Therapy WFL for tasks assessed/performed            Past Medical History:  Diagnosis Date   Acid reflux    Anemia    Arthritis    right thumb - no meds   Breast cancer (HCC) 2005   left breast   Cataract    Diabetes mellitus without complication (HCC)    type 2   Hearing loss    bilateral - no hearing aids   Hyperlipidemia    Hypertension    Personal history of chemotherapy    Personal history of radiation therapy    Past Surgical History:  Procedure Laterality Date   BREAST LUMPECTOMY Left 2005   BREAST SURGERY  2005   Left Br Lumpectomy   CESAREAN SECTION  2 times   chemo and radiation  2005-2006   for breast cancer/ 4 chemo treatments and 36 radiation treatments   COLONOSCOPY     x 2   CYSTOSCOPY N/A 11/25/2015   Procedure: CYSTOSCOPY;  Surgeon: Gigi Botts, MD;  Location: [facility];  Service: Gynecology;  Laterality: N/A;   LAPAROSCOPIC VAGINAL HYSTERECTOMY WITH SALPINGO OOPHORECTOMY Bilateral 11/25/2015   Procedure: LAPAROSCOPIC ASSISTED VAGINAL HYSTERECTOMY WITH BILATERAL SALPINGO OOPHORECTOMY, LAPARSCOPIC CYSTOTOMY REPAIR WITH BILATERAL RETROGRADES;  Surgeon: Gigi Botts, MD;  Location: [facility];  Service: Gynecology;  Laterality: Bilateral;   NASAL SINUS SURGERY     ORIF ANKLE FRACTURE Right 12/06/2023   Procedure: OPEN REDUCTION INTERNAL FIXATION (ORIF) ANKLE FRACTURE;  Surgeon: Genelle Standing, MD;  Location: [facility];  Service: Orthopedics;  Laterality: Right;  RIGHT OPEN REDUCTION INTERNAL  FIXATION LATERAL MALLEOLUS   TONSILLECTOMY     TUBAL LIGATION     WISDOM TOOTH EXTRACTION     Patient Active Problem List   Diagnosis Date Noted   Closed right ankle fracture 11/29/2023   Iron deficiency anemia 08/24/2021   S/P laparoscopic assisted vaginal hysterectomy (LAVH) 11/25/2015   History of breast cancer in female 10/25/2011   Sebaceous cyst 10/25/2011    PCP: Dr Charlie Love   REFERRING PROVIDER: Dr Standing Genelle   REFERRING DIAG:   THERAPY DIAG:  Pain in right ankle and joints of right foot  Stiffness of right ankle, not elsewhere classified  Other abnormalities of gait and mobility  Localized edema  Rationale for Evaluation and Treatment: Rehabilitation  ONSET DATE: DOS 12/06/2023  SUBJECTIVE:   SUBJECTIVE STATEMENT: The patient had no pain after the last visit. She feels like she can take her program and keep working on it on her own.   She had an ORIF performed on 12/06/2023.   PERTINENT HISTORY: DMII, anemia ( not recent) Breast cancer, hearing loss.  PAIN:  Are you having pain? Yes: NPRS scale: 0-1/10 Pain location: lateral ankle  Pain description: burning  Aggravating factors: by the end of the day  Relieving factors: rest   PRECAUTIONS: None  RED FLAGS: None  WEIGHT BEARING RESTRICTIONS: Yes WBAT    FALLS:  Has patient fallen in last 6 months? Just her slip causing her fx   LIVING ENVIRONMENT[place]  OCCUPATION:  Retired  Presenter, broadcasting: active prior to fall   PLOF: Independent  PATIENT GOALS:  To return to active lifestyle   NEXT MD VISIT:  Next week   OBJECTIVE:  Note: Objective measures were completed at Evaluation unless otherwise noted.  DIAGNOSTIC FINDINGS: Nothing post op   PATIENT SURVEYS:  LEFS  Extreme difficulty/unable (0), Quite a bit of difficulty (1), Moderate difficulty (2), Little difficulty (3), No difficulty (4) Survey date:    Any of your usual work, housework or  school activities   2. Usual hobbies, recreational or sporting activities   3. Getting into/out of the bath   4. Walking between rooms   5. Putting on socks/shoes   6. Squatting    7. Lifting an object, like a bag of groceries from the floor   8. Performing light activities around your home   9. Performing heavy activities around your home   10. Getting into/out of a car   11. Walking 2 blocks   12. Walking 1 mile   13. Going up/down 10 stairs (1 flight)   14. Standing for 1 hour   15.  sitting for 1 hour   16. Running on even ground   17. Running on uneven ground   18. Making sharp turns while running fast   19. Hopping    20. Rolling over in bed   Score total:  6/80     COGNITION: Overall cognitive status: Within functional limits for tasks assessed     SENSATION: WFL some burning in the   EDEMA:  Figure 8:     POSTURE: No Significant postural limitations  PALPATION: No unexpected TTP   LOWER EXTREMITY ROM:  Passive ROM Right eval Left eval Left 8/21  Hip flexion     Hip extension     Hip abduction     Hip adduction     Hip internal rotation     Hip external rotation     Knee flexion     Knee extension     Ankle dorsiflexion  2 12  Ankle plantarflexion  8 8  Ankle inversion  nt   Ankle eversion  nt    (Blank rows = not tested)  LOWER EXTREMITY MMT:  MMT Right eval Left eval  Hip flexion    Hip extension    Hip abduction    Hip adduction    Hip internal rotation    Hip external rotation    Knee flexion    Knee extension    Ankle dorsiflexion    Ankle plantarflexion    Ankle inversion    Ankle eversion     (Blank rows = not tested)    GAIT:  TREATMENT DATE:  8/22 Manual: Trigger point release to medial gastroc  Ankle DF stretch with distraction   There-ex:  Ankle 4 way t-band red 2x15  Gastroc stretch  3x20 sec standing  Neuro-re-ed:   Heel/toe rock 3x10  Slow march 3x10 Squat 3x10       8/15 Manual: Trigger point release to medial gastroc  Ankle DF stretch with distraction   There-ex:  Ankle 4 way t-band red 2x15   Neuro-re-ed:  Wobble board x20  Heel/toe rock 3x10  Slow march 3x10       12/15/23:  See below for pt education.   PT removed her post op bandage.  Pt did have some dried blood on bandage.  Stitches present in incision.  PT applied new gauze and tegaderm over incision.  PT educated pt concerning dressings.    Toe flexion/extension AROM x20 reps Quad set with 5 sec hold x 10 reps Supine SLR x10 reps     PATIENT EDUCATION:  Education details:  post op protocol limitations and restrictions, Wb'ing restrictions, appropriate foot wear, ice usage and elevation, exercise form, symptom management, incision, and dressings.  PT answered pt's questions. Person educated: Patient Education method: Explanation, Demonstration, Tactile cues, Verbal cues Education comprehension: verbalized understanding, returned demonstration, verbal cues required, tactile cues required, and needs further education  HOME EXERCISE PROGRAM: URL: [email or web address] Date: 12/09/2023 Prepared by: Alm Don  Exercises - Supine Active Straight Leg Raise  - 1 x daily - 7 x weekly - 3 sets - 10 reps - Seated Ankle Pumps  - 4-5 x daily - 7 x weekly - 3 sets - 10 reps - Seated Long Arc Quad  - 1 x daily - 7 x weekly - 3 sets - 10 reps  ASSESSMENT:  CLINICAL IMPRESSION: Therapy updated her HEP. She feels comfortable with it at this time. She will continue working on her own.  Range of motion compared to her opposite side.  She is lacking a few degrees but nothing major.  We reviewed standing ankle stretching.  She tolerated well.  She reports she feels like it gives her good stretch.  At this time the patient will work on her home exercises.  She may follow-up in 3 to 4  weeks to see how she is doing.  She was advised that she feels like she is doing well she can discharge as well.  OBJECTIVE IMPAIRMENTS: Abnormal gait, decreased activity tolerance, decreased mobility, difficulty walking, decreased ROM, decreased strength, increased edema, and pain.   ACTIVITY LIMITATIONS: carrying, lifting, bending, standing, squatting, stairs, transfers, bathing, dressing, and locomotion level  PARTICIPATION LIMITATIONS: meal prep, cleaning, laundry, driving, shopping, community activity, and yard work  PERSONAL FACTORS: 1 comorbidity: DMII are also affecting patient's functional outcome.   REHAB POTENTIAL: Excellent  CLINICAL DECISION MAKING: Stable/uncomplicated  EVALUATION COMPLEXITY: Low   GOALS: Goals reviewed with patient? Yes  SHORT TERM GOALS: Target date: 01/06/2024   Patient will increase right ankle DF to 10 degrees  Baseline: Goal status: achieved 8/15   2.  Patient will wean out of the boot Baseline:  Goal status: INITIAL  3.  Patient will ambulate 100' with no AD  Baseline:  Goal status: INITIAL   LONG TERM GOALS: Target date: 02/03/2024    Patient will ambulate community distances without pain  Baseline:  Goal status: INITIAL  2.  Patient will go up/down 8 steps with reciprocal gait pattern  Baseline:  Goal status: INITIAL  3.  Patient will return to  full exercise program without pain  Baseline:  Goal status: INITIAL    PLAN:  PT FREQUENCY: 1-2x/week  PT DURATION: 8 weeks  PLANNED INTERVENTIONS: 97110-Therapeutic exercises, 97530- Therapeutic activity, [id number]- Neuromuscular re-education, 97535- Self Care, [id number]- Manual therapy, [id number]- Gait training, [phone]- Aquatic Therapy, [id number]- Electrical stimulation (unattended), 97035- Ultrasound, Patient/Family education, Stair training, Taping, Dry Needling, DME instructions, Cryotherapy, and Moist heat   PLAN FOR NEXT SESSION:  Continue with PROM into DF. Continue gross LE  strengthening. Progress ambulation in boot. Review walking with her walker if she brings it.    Alm Don PT DPT 01/01/24 8:26 PM

## 2024-01-01 ENCOUNTER — Encounter (HOSPITAL_BASED_OUTPATIENT_CLINIC_OR_DEPARTMENT_OTHER): Payer: Self-pay | Admitting: Physical Therapy

## 2024-01-04 ENCOUNTER — Encounter (HOSPITAL_BASED_OUTPATIENT_CLINIC_OR_DEPARTMENT_OTHER): Admitting: Physical Therapy

## 2024-01-06 ENCOUNTER — Encounter (HOSPITAL_BASED_OUTPATIENT_CLINIC_OR_DEPARTMENT_OTHER): Admitting: Physical Therapy

## 2024-01-10 ENCOUNTER — Encounter (HOSPITAL_BASED_OUTPATIENT_CLINIC_OR_DEPARTMENT_OTHER): Admitting: Physical Therapy

## 2024-01-12 ENCOUNTER — Encounter (HOSPITAL_BASED_OUTPATIENT_CLINIC_OR_DEPARTMENT_OTHER): Admitting: Physical Therapy

## 2024-01-17 ENCOUNTER — Other Ambulatory Visit: Payer: Self-pay | Admitting: *Deleted

## 2024-01-17 DIAGNOSIS — D509 Iron deficiency anemia, unspecified: Secondary | ICD-10-CM

## 2024-01-18 ENCOUNTER — Inpatient Hospital Stay: Payer: Medicare Other | Attending: Oncology

## 2024-01-18 ENCOUNTER — Inpatient Hospital Stay

## 2024-01-18 ENCOUNTER — Inpatient Hospital Stay: Payer: Medicare Other | Admitting: Oncology

## 2024-01-18 VITALS — BP 131/58 | HR 73 | Temp 98.0°F | Resp 16 | Wt 142.8 lb

## 2024-01-18 DIAGNOSIS — Z923 Personal history of irradiation: Secondary | ICD-10-CM | POA: Insufficient documentation

## 2024-01-18 DIAGNOSIS — E538 Deficiency of other specified B group vitamins: Secondary | ICD-10-CM | POA: Diagnosis not present

## 2024-01-18 DIAGNOSIS — Z9221 Personal history of antineoplastic chemotherapy: Secondary | ICD-10-CM | POA: Diagnosis not present

## 2024-01-18 DIAGNOSIS — Z853 Personal history of malignant neoplasm of breast: Secondary | ICD-10-CM | POA: Diagnosis not present

## 2024-01-18 DIAGNOSIS — D509 Iron deficiency anemia, unspecified: Secondary | ICD-10-CM | POA: Insufficient documentation

## 2024-01-18 DIAGNOSIS — E119 Type 2 diabetes mellitus without complications: Secondary | ICD-10-CM | POA: Diagnosis not present

## 2024-01-18 DIAGNOSIS — R590 Localized enlarged lymph nodes: Secondary | ICD-10-CM | POA: Insufficient documentation

## 2024-01-18 LAB — CBC WITH DIFFERENTIAL (CANCER CENTER ONLY)
Abs Immature Granulocytes: 0.03 K/uL (ref 0.00–0.07)
Basophils Absolute: 0.1 K/uL (ref 0.0–0.1)
Basophils Relative: 1 %
Eosinophils Absolute: 0.2 K/uL (ref 0.0–0.5)
Eosinophils Relative: 3 %
HCT: 37.2 % (ref 36.0–46.0)
Hemoglobin: 11.8 g/dL — ABNORMAL LOW (ref 12.0–15.0)
Immature Granulocytes: 1 %
Lymphocytes Relative: 27 %
Lymphs Abs: 1.8 K/uL (ref 0.7–4.0)
MCH: 29.9 pg (ref 26.0–34.0)
MCHC: 31.7 g/dL (ref 30.0–36.0)
MCV: 94.2 fL (ref 80.0–100.0)
Monocytes Absolute: 0.7 K/uL (ref 0.1–1.0)
Monocytes Relative: 11 %
Neutro Abs: 3.8 K/uL (ref 1.7–7.7)
Neutrophils Relative %: 57 %
Platelet Count: 389 K/uL (ref 150–400)
RBC: 3.95 MIL/uL (ref 3.87–5.11)
RDW: 14.8 % (ref 11.5–15.5)
WBC Count: 6.5 K/uL (ref 4.0–10.5)
nRBC: 0 % (ref 0.0–0.2)

## 2024-01-18 LAB — FERRITIN: Ferritin: 118 ng/mL (ref 11–307)

## 2024-01-18 MED ORDER — INFLUENZA VAC SPLIT HIGH-DOSE 0.5 ML IM SUSY
0.5000 mL | PREFILLED_SYRINGE | Freq: Once | INTRAMUSCULAR | Status: DC
  Filled 2024-01-18: qty 0.5

## 2024-01-18 NOTE — Progress Notes (Signed)
 Krystal [name], Krystal cancer  INTERVAL HISTORY:   Krystal [name] returns as scheduled.  She fell and fractured her leg in July.  She underwent an open reduction internal fixation of a distal right fibular fracture on 12/06/2023.  She reports recovering well from surgery.  She generally feels well.  No difficulty with bowel function.  No bleeding.  She continues iron therapy.  No complaint.  Objective:  Vital signs in last 24 hours:  Blood pressure (!) 131/58, pulse 73, temperature 98 F (36.7 C), temperature source Temporal, resp. rate 16, weight 142 lb 12.8 oz (64.8 kg), SpO2 98%.    Lymphatics: No cervical, supraclavicular, axillary, or inguinal nodes Resp: Lungs clear bilaterally Cardio: Regular rate and rhythm GI: No hepatosplenomegaly, nontender, no mass Vascular: No leg edema Krystal: No mass in either Krystal.  There is firm tissue surrounding the left lumpectomy scar with an oval mobile area superior to the scar   Lab Results:  Lab Results  Component Value Date   WBC 6.5 01/18/2024   HGB 11.8 (L) 01/18/2024   HCT 37.2 01/18/2024   MCV 94.2 01/18/2024   PLT 389 01/18/2024   NEUTROABS 3.8 01/18/2024    CMP  Lab Results  Component Value Date   NA 140 12/06/2023   K 3.2 (L) 12/06/2023   CL 103 12/06/2023   CO2 24 12/06/2023   GLUCOSE 71 12/06/2023   BUN 9 12/06/2023   CREATININE 0.68 12/06/2023   CALCIUM 9.1 12/06/2023   PROT 7.8 11/12/2015   ALBUMIN 3.7 11/12/2015   AST 19 11/12/2015   ALT 15 11/12/2015   ALKPHOS 85 11/12/2015   BILITOT 0.5 11/12/2015   GFRNONAA >60 12/06/2023   GFRAA >60 11/25/2015    Medications: I have reviewed the patient's current medications.   Assessment/Plan: Iron deficiency [name] 07/14/2021 upper endoscopy-normal esophagus, normal stomach, hiatal hernia without erosions, normal examined duodenum, biopsied duodenal bulb with benign duodenal mucosa with no  diagnostic abnormality  07/14/2021 colonoscopy-diverticulosis in the sigmoid colon, normal terminal ileum, small internal hemorrhoids;  07/14/2021 ferritin 5.8 08/03/2021 capsule endoscopy-edematous mucosa and inflammatory changes starting at 3-hour 6 minutes, continuing in a patchy fashion with multiple ulcerations, edema through the 4-hour mark where severe inflammatory changes and ulceration noted.  CT enterography planned. CT enterography abdomen/pelvis 08/25/2021-mucosal thickening and enhancement of jejunal loops, numerous enlarged lymph nodes throughout the small bowel mesentery felt to be reactive Feraheme  09/02/2021, 09/09/2021 CT enterography abdomen/pelvis 12/08/2021-no change in evidence of enterocolitis with associated mildly enlarged mesenteric nodes CT enterography abdomen/pelvis 07/15/2022-mild shotty mesenteric lymphadenopathy with mild improvement CT abdomen/pelvis at [facility] 07/06/2023: Mild wall thickening and inflammation associated with a segment of jejunum, mild enlargement of mesenteric lymph nodes, nodules up to 4 mm at the base of the right upper lobe 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  She completed AC chemotherapy and left Krystal radiation.  She then completed 5 years of adjuvant Arimidex. B12 deficiency Diabetes      Disposition: Krystal [name] remains in clinical remission from Krystal cancer.  She is due for a screening mammogram in December.  She continues follow-up with Dr. Abran for evaluation of iron deficiency [name] and small bowel wall thickening/mesenteric lymph nodes noted on CT.  She has no GI symptoms at present.   The hemoglobin is at the low end of the normal range today and the platelet count is normal.  She continues iron therapy.  Krystal [name] will return  for an office and lab visit in 6 months.    Arley Hof, MD  01/18/2024  11:56 AM

## 2024-01-19 ENCOUNTER — Ambulatory Visit (HOSPITAL_BASED_OUTPATIENT_CLINIC_OR_DEPARTMENT_OTHER)

## 2024-01-19 ENCOUNTER — Ambulatory Visit (INDEPENDENT_AMBULATORY_CARE_PROVIDER_SITE_OTHER): Admitting: Orthopaedic Surgery

## 2024-01-19 DIAGNOSIS — S82891A Other fracture of right lower leg, initial encounter for closed fracture: Secondary | ICD-10-CM

## 2024-01-19 NOTE — Progress Notes (Signed)
 Chief Complaint: Right ankle open reduction internal fixation 7/29     History of Present Illness:   01/19/2024: Resents today 6 weeks status post the above procedure.  Overall she doing well.  She is now fully weightbearing without any pain.  Only occasional mild swelling  Krystal [name] is a 73 y.o. female presents 2 weeks status post above procedure.  Overall doing very well.  She is now walking without any type of boot.  She is fully weightbearing doing quite well    PMH/PSH/Family History/Social History/Meds/Allergies:    Past Medical History:  Diagnosis Date   Acid reflux    Anemia    Arthritis    right thumb - no meds   Breast cancer (HCC) 2005   left breast   Cataract    Diabetes mellitus without complication (HCC)    type 2   Hearing loss    bilateral - no hearing aids   Hyperlipidemia    Hypertension    Personal history of chemotherapy    Personal history of radiation therapy    Past Surgical History:  Procedure Laterality Date   BREAST LUMPECTOMY Left 2005   BREAST SURGERY  2005   Left Br Lumpectomy   CESAREAN SECTION  2 times   chemo and radiation  2005-2006   for breast cancer/ 4 chemo treatments and 36 radiation treatments   COLONOSCOPY     x 2   CYSTOSCOPY N/A 11/25/2015   Procedure: CYSTOSCOPY;  Surgeon: Gigi Botts, MD;  Location: [facility];  Service: Gynecology;  Laterality: N/A;   LAPAROSCOPIC VAGINAL HYSTERECTOMY WITH SALPINGO OOPHORECTOMY Bilateral 11/25/2015   Procedure: LAPAROSCOPIC ASSISTED VAGINAL HYSTERECTOMY WITH BILATERAL SALPINGO OOPHORECTOMY, LAPARSCOPIC CYSTOTOMY REPAIR WITH BILATERAL RETROGRADES;  Surgeon: Gigi Botts, MD;  Location: [facility];  Service: Gynecology;  Laterality: Bilateral;   NASAL SINUS SURGERY     ORIF ANKLE FRACTURE Right 12/06/2023   Procedure: OPEN REDUCTION INTERNAL FIXATION (ORIF) ANKLE FRACTURE;  Surgeon: Genelle Standing, MD;  Location: [facility];  Service: Orthopedics;  Laterality: Right;  RIGHT OPEN REDUCTION INTERNAL  FIXATION LATERAL MALLEOLUS   TONSILLECTOMY     TUBAL LIGATION     WISDOM TOOTH EXTRACTION     Social History   Socioeconomic History   Marital status: Married    Spouse name: Not on file   Number of children: Not on file   Years of education: Not on file   Highest education level: Not on file  Occupational History   Not on file  Tobacco Use   Smoking status: Never   Smokeless tobacco: Never  Substance and Sexual Activity   Alcohol use: No   Drug use: No   Sexual activity: Yes    Birth control/protection: Post-menopausal  Other Topics Concern   Not on file  Social History Narrative   Not on file   Social Drivers of Health   Financial Resource Strain: Not on file  Food Insecurity: Not on file  Transportation Needs: Not on file  Physical Activity: Not on file  Stress: Not on file  Social Connections: Not on file   Family History  Problem Relation Age of Onset   Heart disease Mother    Heart disease Father    Breast cancer Maternal Aunt    No Known Allergies Current Outpatient Medications  Medication Sig Dispense Refill   acetaminophen  (TYLENOL ) 500 MG tablet Take 500 mg by mouth every 6 (six) hours as needed for moderate pain (pain score 4-6).  aspirin  EC 325 MG tablet Take 1 tablet (325 mg total) by mouth daily. 14 tablet 0   calcium carbonate (TUMS EX) 750 MG chewable tablet Chew 1 tablet by mouth as needed for heartburn.     ferrous sulfate  325 (65 FE) MG EC tablet TAKE 1 TABLET BY MOUTH TWO TIMES A DAY BEFORE A MEAL 60 tablet 3   HUMALOG 100 UNIT/ML injection Inject into the skin See admin instructions. Uses in Insulin  Pump     Insulin  Human (INSULIN  PUMP) SOLN Inject 1 each into the skin continuous. V-Go Disposable Insulin  Delivery Device administers 78 units per 24 hours of insulin  lispro (Humalog).     Lancets (ONETOUCH ULTRASOFT) lancets      metFORMIN  (GLUCOPHAGE ) 500 MG tablet Take 1,000 mg by mouth 2 (two) times daily with a meal.     ONE TOUCH ULTRA  TEST test strip      oxyCODONE  (ROXICODONE ) 5 MG immediate release tablet Take 1 tablet (5 mg total) by mouth every 4 (four) hours as needed for severe pain (pain score 7-10) or breakthrough pain. (Patient not taking: Reported on 01/18/2024) 20 tablet 0   pravastatin (PRAVACHOL) 40 MG tablet Take 40 mg by mouth at bedtime.     valsartan-hydrochlorothiazide (DIOVAN-HCT) 160-25 MG per tablet Take 1 tablet by mouth Daily.     No current facility-administered medications for this visit.   Facility-Administered Medications Ordered in Other Visits  Medication Dose Route Frequency Provider Last Rate Last Admin   iopamidol  (ISOVUE -300) 61 % injection 90 mL  90 mL Other Once PRN Pinn, Walda, MD       iopamidol  (ISOVUE -300) 61 % injection 90 mL  90 mL Intravenous Once PRN Pinn, Walda, MD       No results found.  Review of Systems:   A ROS was performed including pertinent positives and negatives as documented in the HPI.  Physical Exam :   Constitutional: NAD and appears stated age Neurological: Alert and oriented Psych: Appropriate affect and cooperative There were no vitals taken for this visit.   Comprehensive Musculoskeletal Exam:    Right ankle with minimal swelling and no tenderness about the joint line.  Incision is well-appearing without erythema or drainage.  Distal neurosensory exam is intact   Imaging:   Xray (3 views right ankle): Status post open reduction internal fixation without evidence of complication     I personally reviewed and interpreted the radiographs.   Assessment and Plan:   73 y.o. female status post right ankle open reduction internal fixation without evidence of complication overall doing extremely well.  This time I will plan to see her back in 6 weeks for reassessment.  -Return to clinic 6 weeks for reassessment   I personally saw and evaluated the patient, and participated in the management and treatment plan.  Elspeth Parker, MD Attending  Physician, Orthopedic Surgery  This document was dictated using Dragon voice recognition software. A reasonable attempt at proof reading has been made to minimize errors.

## 2024-03-07 ENCOUNTER — Ambulatory Visit (INDEPENDENT_AMBULATORY_CARE_PROVIDER_SITE_OTHER)

## 2024-03-07 ENCOUNTER — Ambulatory Visit (HOSPITAL_BASED_OUTPATIENT_CLINIC_OR_DEPARTMENT_OTHER): Admitting: Orthopaedic Surgery

## 2024-03-07 DIAGNOSIS — S82891A Other fracture of right lower leg, initial encounter for closed fracture: Secondary | ICD-10-CM

## 2024-03-07 NOTE — Progress Notes (Signed)
 Chief Complaint: Right ankle open reduction internal fixation 7/29     History of Present Illness:   03/07/2024: Resents today 3 months status post ankle fixation doing extremely well.  She is now walking without any pain or swelling  Krystal [name] is a 73 y.o. female presents 2 weeks status post above procedure.  Overall doing very well.  She is now walking without any type of boot.  She is fully weightbearing doing quite well    PMH/PSH/Family History/Social History/Meds/Allergies:    Past Medical History:  Diagnosis Date   Acid reflux    Anemia    Arthritis    right thumb - no meds   Breast cancer (HCC) 2005   left breast   Cataract    Diabetes mellitus without complication (HCC)    type 2   Hearing loss    bilateral - no hearing aids   Hyperlipidemia    Hypertension    Personal history of chemotherapy    Personal history of radiation therapy    Past Surgical History:  Procedure Laterality Date   BREAST LUMPECTOMY Left 2005   BREAST SURGERY  2005   Left Br Lumpectomy   CESAREAN SECTION  2 times   chemo and radiation  2005-2006   for breast cancer/ 4 chemo treatments and 36 radiation treatments   COLONOSCOPY     x 2   CYSTOSCOPY N/A 11/25/2015   Procedure: CYSTOSCOPY;  Surgeon: Gigi Botts, MD;  Location: [facility];  Service: Gynecology;  Laterality: N/A;   LAPAROSCOPIC VAGINAL HYSTERECTOMY WITH SALPINGO OOPHORECTOMY Bilateral 11/25/2015   Procedure: LAPAROSCOPIC ASSISTED VAGINAL HYSTERECTOMY WITH BILATERAL SALPINGO OOPHORECTOMY, LAPARSCOPIC CYSTOTOMY REPAIR WITH BILATERAL RETROGRADES;  Surgeon: Gigi Botts, MD;  Location: [facility];  Service: Gynecology;  Laterality: Bilateral;   NASAL SINUS SURGERY     ORIF ANKLE FRACTURE Right 12/06/2023   Procedure: OPEN REDUCTION INTERNAL FIXATION (ORIF) ANKLE FRACTURE;  Surgeon: Genelle Standing, MD;  Location: [facility];  Service: Orthopedics;  Laterality: Right;  RIGHT OPEN REDUCTION INTERNAL FIXATION LATERAL MALLEOLUS    TONSILLECTOMY     TUBAL LIGATION     WISDOM TOOTH EXTRACTION     Social History   Socioeconomic History   Marital status: Married    Spouse name: Not on file   Number of children: Not on file   Years of education: Not on file   Highest education level: Not on file  Occupational History   Not on file  Tobacco Use   Smoking status: Never   Smokeless tobacco: Never  Substance and Sexual Activity   Alcohol use: No   Drug use: No   Sexual activity: Yes    Birth control/protection: Post-menopausal  Other Topics Concern   Not on file  Social History Narrative   Not on file   Social Drivers of Health   Financial Resource Strain: Not on file  Food Insecurity: Not on file  Transportation Needs: Not on file  Physical Activity: Not on file  Stress: Not on file  Social Connections: Not on file   Family History  Problem Relation Age of Onset   Heart disease Mother    Heart disease Father    Breast cancer Maternal Aunt    No Known Allergies Current Outpatient Medications  Medication Sig Dispense Refill   acetaminophen  (TYLENOL ) 500 MG tablet Take 500 mg by mouth every 6 (six) hours as needed for moderate pain (pain score 4-6).     aspirin  EC 325 MG tablet Take  1 tablet (325 mg total) by mouth daily. 14 tablet 0   calcium carbonate (TUMS EX) 750 MG chewable tablet Chew 1 tablet by mouth as needed for heartburn.     ferrous sulfate  325 (65 FE) MG EC tablet TAKE 1 TABLET BY MOUTH TWO TIMES A DAY BEFORE A MEAL 60 tablet 3   HUMALOG 100 UNIT/ML injection Inject into the skin See admin instructions. Uses in Insulin  Pump     Insulin  Human (INSULIN  PUMP) SOLN Inject 1 each into the skin continuous. V-Go Disposable Insulin  Delivery Device administers 78 units per 24 hours of insulin  lispro (Humalog).     Lancets (ONETOUCH ULTRASOFT) lancets      metFORMIN  (GLUCOPHAGE ) 500 MG tablet Take 1,000 mg by mouth 2 (two) times daily with a meal.     ONE TOUCH ULTRA TEST test strip      oxyCODONE   (ROXICODONE ) 5 MG immediate release tablet Take 1 tablet (5 mg total) by mouth every 4 (four) hours as needed for severe pain (pain score 7-10) or breakthrough pain. (Patient not taking: Reported on 01/18/2024) 20 tablet 0   pravastatin (PRAVACHOL) 40 MG tablet Take 40 mg by mouth at bedtime.     valsartan-hydrochlorothiazide (DIOVAN-HCT) 160-25 MG per tablet Take 1 tablet by mouth Daily.     No current facility-administered medications for this visit.   Facility-Administered Medications Ordered in Other Visits  Medication Dose Route Frequency Provider Last Rate Last Admin   iopamidol  (ISOVUE -300) 61 % injection 90 mL  90 mL Other Once PRN Pinn, Walda, MD       iopamidol  (ISOVUE -300) 61 % injection 90 mL  90 mL Intravenous Once PRN Pinn, Walda, MD       No results found.  Review of Systems:   A ROS was performed including pertinent positives and negatives as documented in the HPI.  Physical Exam :   Constitutional: NAD and appears stated age Neurological: Alert and oriented Psych: Appropriate affect and cooperative There were no vitals taken for this visit.   Comprehensive Musculoskeletal Exam:    Right ankle with minimal swelling and no tenderness about the joint line.  Incision is well-appearing without erythema or drainage.  Distal neurosensory exam is intact   Imaging:   Xray (3 views right ankle): Status post open reduction internal fixation without evidence of complication     I personally reviewed and interpreted the radiographs.   Assessment and Plan:   73 y.o. female status post right ankle open reduction internal fixation without evidence of complication she is now back to normal life without pain.  I will plan to see her back as needed   I personally saw and evaluated the patient, and participated in the management and treatment plan.  Elspeth Parker, MD Attending Physician, Orthopedic Surgery  This document was dictated using Dragon voice recognition software.  A reasonable attempt at proof reading has been made to minimize errors.

## 2024-03-12 ENCOUNTER — Encounter: Payer: Self-pay | Admitting: Radiology

## 2024-03-27 ENCOUNTER — Ambulatory Visit: Admitting: Internal Medicine

## 2024-03-27 ENCOUNTER — Encounter: Payer: Self-pay | Admitting: Internal Medicine

## 2024-03-27 VITALS — BP 122/68 | HR 84 | Ht 60.0 in | Wt 146.2 lb

## 2024-03-27 DIAGNOSIS — E538 Deficiency of other specified B group vitamins: Secondary | ICD-10-CM | POA: Diagnosis not present

## 2024-03-27 DIAGNOSIS — R9389 Abnormal findings on diagnostic imaging of other specified body structures: Secondary | ICD-10-CM

## 2024-03-27 DIAGNOSIS — K529 Noninfective gastroenteritis and colitis, unspecified: Secondary | ICD-10-CM

## 2024-03-27 DIAGNOSIS — D509 Iron deficiency anemia, unspecified: Secondary | ICD-10-CM

## 2024-03-27 NOTE — Patient Instructions (Signed)
 We will call you in 6 months to schedule CT Enterography and follow up appointment.  _______________________________________________________  If your blood pressure at your visit was 140/90 or greater, please contact your primary care physician to follow up on this.  _______________________________________________________  If you are age 75 or older, your body mass index should be between 23-30. Your Body mass index is 28.55 kg/m. If this is out of the aforementioned range listed, please consider follow up with your Primary Care Provider.  If you are age 24 or younger, your body mass index should be between 19-25. Your Body mass index is 28.55 kg/m. If this is out of the aformentioned range listed, please consider follow up with your Primary Care Provider.   ________________________________________________________  The [facility] providers would like to encourage you to use MYCHART to communicate with providers for non-urgent requests or questions.  Due to long hold times on the telephone, sending your provider a message by [facility] may be a faster and more efficient way to get a response.  Please allow 48 business hours for a response.  Please remember that this is for non-urgent requests.  _______________________________________________________  [facility] is using a team-based approach to care.  Your team is made up of your doctor and two to three APPS. Our APPS (Nurse Practitioners and Physician Assistants) work with your physician to ensure care continuity for you. They are fully qualified to address your health concerns and develop a treatment plan. They communicate directly with your gastroenterologist to care for you. Seeing the Advanced Practice Practitioners on your physician's team can help you by facilitating care more promptly, often allowing for earlier appointments, access to diagnostic testing, procedures, and other specialty referrals.

## 2024-03-27 NOTE — Progress Notes (Signed)
 HISTORY OF PRESENT ILLNESS:  Krystal [name] is a 73 y.o. female with past medical history as listed below who presents today for follow-up regarding prior history of iron deficiency anemia and abnormal area of jejunum on capsule endoscopy and CT enterography.  She was last seen in this office June 01, 2023.  Subsequent CT enterography May 2025 revealed focal jejunal inflammatory change with small surrounding mesenteric nodes.  Unchanged from previous exams.  She continues to follow with hematology regarding iron deficiency anemia.  Iron levels are normal.  Hemoglobins have been normal with the exception of her most recent value January 18, 2024 which was slightly low at 11.8.  Since her last visit she fractured her right ankle and underwent surgery.  Has recovered nicely.  From a GI perspective she still remains completely asymptomatic.  Normal appetite.  No abdominal pain.  Stable weight.  Regular bowel habits.  No bleeding.SABRA  REVIEW OF SYSTEMS:  All non-GI ROS negative entirely  Past Medical History:  Diagnosis Date   Acid reflux    Anemia    Arthritis    right thumb - no meds   Breast cancer (HCC) 2005   left breast   Cataract    Diabetes mellitus without complication (HCC)    type 2   Hearing loss    bilateral - no hearing aids   Hyperlipidemia    Hypertension    Personal history of chemotherapy    Personal history of radiation therapy     Past Surgical History:  Procedure Laterality Date   BREAST LUMPECTOMY Left 2005   BREAST SURGERY  2005   Left Br Lumpectomy   CESAREAN SECTION  2 times   chemo and radiation  2005-2006   for breast cancer/ 4 chemo treatments and 36 radiation treatments   COLONOSCOPY     x 2   CYSTOSCOPY N/A 11/25/2015   Procedure: CYSTOSCOPY;  Surgeon: Gigi Botts, MD;  Location: [facility];  Service: Gynecology;  Laterality: N/A;   LAPAROSCOPIC VAGINAL HYSTERECTOMY WITH SALPINGO OOPHORECTOMY Bilateral 11/25/2015   Procedure: LAPAROSCOPIC ASSISTED  VAGINAL HYSTERECTOMY WITH BILATERAL SALPINGO OOPHORECTOMY, LAPARSCOPIC CYSTOTOMY REPAIR WITH BILATERAL RETROGRADES;  Surgeon: Gigi Botts, MD;  Location: [facility];  Service: Gynecology;  Laterality: Bilateral;   NASAL SINUS SURGERY     ORIF ANKLE FRACTURE Right 12/06/2023   Procedure: OPEN REDUCTION INTERNAL FIXATION (ORIF) ANKLE FRACTURE;  Surgeon: Genelle Standing, MD;  Location: [facility];  Service: Orthopedics;  Laterality: Right;  RIGHT OPEN REDUCTION INTERNAL FIXATION LATERAL MALLEOLUS   TONSILLECTOMY     TUBAL LIGATION     WISDOM TOOTH EXTRACTION      Social History Krystal [name]  reports that she has never smoked. She has never used smokeless tobacco. She reports that she does not drink alcohol and does not use drugs.  family history includes Breast cancer in her maternal aunt; Heart disease in her father and mother.  No Known Allergies     PHYSICAL EXAMINATION: Vital signs: BP 122/68   Pulse 84   Ht 5' (1.524 m)   Wt 146 lb 3.2 oz (66.3 kg)   BMI 28.55 kg/m   Constitutional: generally well-appearing, no acute distress Psychiatric: alert and oriented x3, cooperative Eyes: extraocular movements intact, anicteric, conjunctiva pink Mouth: oral pharynx moist, no lesions Neck: supple no lymphadenopathy Cardiovascular: heart regular rate and rhythm, no murmur Lungs: clear to auscultation bilaterally Abdomen: soft, nontender, nondistended, no obvious ascites, no peritoneal signs, normal bowel sounds, no organomegaly Rectal: Omitted Extremities:  no, cyanosis, or lower extremity edema bilaterally Skin: no lesions on visible extremities Neuro: No focal deficits.  Cranial nerves intact  ASSESSMENT:   1.  Abnormal area of jejunum as described.  Radiographic abnormality has improved, but not resolved, with reactive appearing lymphadenopathy.  Remains asymptomatic.  Remains under close observation. 2.  History of iron deficiency anemia.  Extensive work-up as previously outlined.  On  replacement with normal hemoglobin.  Followed by hematology 3.  B12 deficiency.  On replacement with normal hemoglobin     PLAN:   1.  Continue iron 2.  Continue B12 3.  Continue to avoid NSAIDs. 4.  Office follow-up in 6 months.  She knows to contact the office in the interim for any questions or problems 5.  Repeat CT enterography prior to office follow-up, in May 2026 6.  Ongoing general medical care with Dr. Jennifer A total time of 30 minutes was spent preparing to see the patient, reviewing data, obtaining comprehensive interval history, performing medically appropriate physical exam, counseling and educating the patient regarding the above listed issues, arranging follow-up, ordering future blood work, ordering future advanced imaging, and documenting clinical information in the health record

## 2024-05-14 ENCOUNTER — Other Ambulatory Visit (HOSPITAL_BASED_OUTPATIENT_CLINIC_OR_DEPARTMENT_OTHER): Payer: Self-pay | Admitting: Internal Medicine

## 2024-05-14 DIAGNOSIS — Z1231 Encounter for screening mammogram for malignant neoplasm of breast: Secondary | ICD-10-CM

## 2024-05-28 ENCOUNTER — Ambulatory Visit (HOSPITAL_BASED_OUTPATIENT_CLINIC_OR_DEPARTMENT_OTHER)
Admission: RE | Admit: 2024-05-28 | Discharge: 2024-05-28 | Disposition: A | Source: Ambulatory Visit | Attending: Internal Medicine | Admitting: Internal Medicine

## 2024-05-28 ENCOUNTER — Encounter (HOSPITAL_BASED_OUTPATIENT_CLINIC_OR_DEPARTMENT_OTHER): Payer: Self-pay | Admitting: Radiology

## 2024-05-28 DIAGNOSIS — Z1231 Encounter for screening mammogram for malignant neoplasm of breast: Secondary | ICD-10-CM | POA: Insufficient documentation

## 2024-07-17 ENCOUNTER — Ambulatory Visit: Admitting: Oncology

## 2024-07-17 ENCOUNTER — Other Ambulatory Visit

## 2024-07-17 ENCOUNTER — Ambulatory Visit
